# Patient Record
Sex: Male | Born: 1940 | Race: White | Hispanic: No | Marital: Married | Smoking: Former smoker
Health system: Southern US, Community
[De-identification: ages and names within clinical notes are randomized; demographics above are authoritative.]

## PROBLEM LIST (undated history)

## (undated) DIAGNOSIS — J45909 Unspecified asthma, uncomplicated: Secondary | ICD-10-CM

## (undated) DIAGNOSIS — M12811 Other specific arthropathies, not elsewhere classified, right shoulder: Secondary | ICD-10-CM

## (undated) DIAGNOSIS — E785 Hyperlipidemia, unspecified: Secondary | ICD-10-CM

## (undated) DIAGNOSIS — I10 Essential (primary) hypertension: Secondary | ICD-10-CM

## (undated) DIAGNOSIS — E1140 Type 2 diabetes mellitus with diabetic neuropathy, unspecified: Secondary | ICD-10-CM

## (undated) DIAGNOSIS — M75101 Unspecified rotator cuff tear or rupture of right shoulder, not specified as traumatic: Secondary | ICD-10-CM

## (undated) HISTORY — PX: OTHER SURGICAL HISTORY: SHX169

---
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
## 1998-03-08 HISTORY — PX: TONSILLECTOMY: SUR1361

## 1998-03-08 HISTORY — PX: NASAL SEPTUM SURGERY: SHX37

## 2005-06-16 ENCOUNTER — Encounter: Admission: RE | Admit: 2005-06-16 | Discharge: 2005-06-16 | Payer: Self-pay | Admitting: Physician Assistant

## 2011-07-23 ENCOUNTER — Other Ambulatory Visit (HOSPITAL_COMMUNITY): Payer: Self-pay | Admitting: Family Medicine

## 2011-07-23 DIAGNOSIS — R0602 Shortness of breath: Secondary | ICD-10-CM

## 2011-08-04 ENCOUNTER — Ambulatory Visit (HOSPITAL_COMMUNITY)
Admission: RE | Admit: 2011-08-04 | Discharge: 2011-08-04 | Disposition: A | Discharge status: Home / Self Care | Payer: Medicare Other | Source: Ambulatory Visit | Attending: Family Medicine | Admitting: Family Medicine

## 2011-08-04 DIAGNOSIS — R0602 Shortness of breath: Secondary | ICD-10-CM | POA: Insufficient documentation

## 2011-08-04 MED ORDER — ALBUTEROL SULFATE (5 MG/ML) 0.5% IN NEBU
2.5000 mg | INHALATION_SOLUTION | Freq: Once | RESPIRATORY_TRACT | Status: AC
  Administered 2011-08-04: 2.5 mg via RESPIRATORY_TRACT

## 2015-03-07 ENCOUNTER — Ambulatory Visit
Admission: RE | Admit: 2015-03-07 | Discharge: 2015-03-07 | Disposition: A | Discharge status: Home / Self Care | Payer: Self-pay | Source: Ambulatory Visit | Attending: *Deleted | Admitting: *Deleted

## 2015-03-07 ENCOUNTER — Other Ambulatory Visit: Payer: Self-pay | Admitting: Family Medicine

## 2015-03-07 ENCOUNTER — Other Ambulatory Visit: Payer: Self-pay | Admitting: *Deleted

## 2015-03-07 DIAGNOSIS — M5410 Radiculopathy, site unspecified: Secondary | ICD-10-CM

## 2018-06-14 ENCOUNTER — Encounter: Payer: Medicare Other | Admitting: Neurology

## 2018-09-20 ENCOUNTER — Ambulatory Visit (INDEPENDENT_AMBULATORY_CARE_PROVIDER_SITE_OTHER): Payer: Medicare Other | Admitting: Neurology

## 2018-09-20 ENCOUNTER — Other Ambulatory Visit: Payer: Self-pay

## 2018-09-20 ENCOUNTER — Encounter: Payer: Self-pay | Admitting: Neurology

## 2018-09-20 ENCOUNTER — Ambulatory Visit: Payer: Medicare Other | Admitting: Neurology

## 2018-09-20 DIAGNOSIS — E1142 Type 2 diabetes mellitus with diabetic polyneuropathy: Secondary | ICD-10-CM

## 2018-09-20 DIAGNOSIS — E1140 Type 2 diabetes mellitus with diabetic neuropathy, unspecified: Secondary | ICD-10-CM

## 2018-09-20 HISTORY — DX: Type 2 diabetes mellitus with diabetic neuropathy, unspecified: E11.40

## 2018-09-20 NOTE — Procedures (Signed)
     HISTORY:  [name] is a 78 year old gentleman with a history of diabetes who has a one-year history of some numbness in the left greater than right foot.  He denies any back pain or pain down the leg but he does have some discomfort in the medial aspect of the left knee.  He is being evaluated for a possible neuropathy or a lumbosacral radiculopathy.  NERVE CONDUCTION STUDIES:  Nerve conduction studies were performed on both lower extremities.  The distal motor latencies for the peroneal and posterior tibial nerves were normal bilaterally with low motor amplitudes seen for the posterior tibial nerves bilaterally, normal for the peroneal nerves bilaterally.  The nerve conduction velocities for the left peroneal nerve and for the posterior tibial nerves bilaterally were slowed, normal for the right peroneal nerve.  The sensory latencies for the peroneal nerves were prolonged bilaterally, normal for the sural nerves bilaterally.  The F-wave latencies for the posterior tibial nerves were prolonged bilaterally.  EMG STUDIES:  EMG study was performed on the left lower extremity:  The tibialis anterior muscle reveals 2 to 4K motor units with slightly reduced recruitment. No fibrillations or positive waves were seen. The peroneus tertius muscle reveals 2 to 4K motor units with slightly reduced recruitment. No fibrillations or positive waves were seen. The medial gastrocnemius muscle reveals 1 to 3K motor units with full recruitment. No fibrillations or positive waves were seen. The vastus lateralis muscle reveals 2 to 4K motor units with full recruitment. No fibrillations or positive waves were seen. The iliopsoas muscle reveals 2 to 4K motor units with full recruitment. No fibrillations or positive waves were seen. The biceps femoris muscle (long head) reveals 2 to 4K motor units with full recruitment. No fibrillations or positive waves were seen. The lumbosacral paraspinal muscles were  tested at 3 levels, and revealed no abnormalities of insertional activity at all 3 levels tested. There was good relaxation.   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  EMG evaluation of the left lower extremity does not show evidence of an overlying lumbosacral radiculopathy.  Jill Alexanders MD 09/20/2018 1:43 PM  [facility] [address] [place], [place] [address]  Phone [phone] Fax [phone]

## 2018-09-20 NOTE — Progress Notes (Signed)
[name]    Nerve / Sites Muscle Latency Ref. Amplitude Ref. Rel Amp Segments Distance Velocity Ref. Area    ms ms mV mV %  cm m/s m/s mVms  R Peroneal - EDB     Ankle EDB 5.1 ?6.5 5.2 ?2.0 100 Ankle - EDB 9   18.0     Fib head EDB 11.4  5.0  95.1 Fib head - Ankle 30 48 ?44 17.4     Pop fossa EDB 13.6  4.9  98.7 Pop fossa - Fib head 10 44 ?44 17.1         Pop fossa - Ankle      L Peroneal - EDB     Ankle EDB 5.0 ?6.5 4.4 ?2.0 100 Ankle - EDB 9   13.5     Fib head EDB 12.4  3.8  86.3 Fib head - Ankle 30 41 ?44 12.2     Pop fossa EDB 14.8  3.7  96.7 Pop fossa - Fib head 10 41 ?44 12.1         Pop fossa - Ankle      R Tibial - AH     Ankle AH 4.0 ?5.8 2.8 ?4.0 100 Ankle - AH 9   10.0     Pop fossa AH 14.3  1.6  56.9 Pop fossa - Ankle 38 37 ?41 5.7  L Tibial - AH     Ankle AH 3.4 ?5.8 2.3 ?4.0 100 Ankle - AH 9   7.0     Pop fossa AH 14.7  1.9  80.1 Pop fossa - Ankle 39 35 ?41 5.1             SNC    Nerve / Sites Rec. Site Peak Lat Ref.  Amp Ref. Segments Distance    ms ms V V  cm  R Sural - Ankle (Calf)     Calf Ankle 3.9 ?4.4 6 ?6 Calf - Ankle 14  L Sural - Ankle (Calf)     Calf Ankle 4.2 ?4.4 6 ?6 Calf - Ankle 14  R Superficial peroneal - Ankle     Lat leg Ankle 4.6 ?4.4 4 ?6 Lat leg - Ankle 14  L Superficial peroneal - Ankle     Lat leg Ankle 4.7 ?4.4 4 ?6 Lat leg - Ankle 14              F  Wave    Nerve F Lat Ref.   ms ms  R Tibial - AH 68.6 ?56.0  L Tibial - AH 64.6 ?56.0

## 2018-09-20 NOTE — Progress Notes (Signed)
Please refer to EMG and nerve conduction procedure note.  

## 2019-03-05 ENCOUNTER — Other Ambulatory Visit: Payer: Self-pay

## 2019-03-05 ENCOUNTER — Ambulatory Visit
Admission: RE | Admit: 2019-03-05 | Discharge: 2019-03-05 | Disposition: A | Discharge status: Home / Self Care | Payer: Medicare Other | Source: Ambulatory Visit | Attending: Family Medicine | Admitting: Family Medicine

## 2019-03-05 ENCOUNTER — Other Ambulatory Visit: Payer: Self-pay | Admitting: Family Medicine

## 2019-03-05 DIAGNOSIS — Z01811 Encounter for preprocedural respiratory examination: Secondary | ICD-10-CM

## 2019-03-15 NOTE — H&P (Signed)
  Patient's anticipated LOS is less than 2 midnights, meeting these requirements: - Younger than 51 - Lives within 1 hour of care - Has a competent adult at home to recover with post-op recover - NO history of  - Chronic pain requiring opiods  - Diabetes  - Coronary Artery Disease  - Heart failure  - Heart attack  - Stroke  - DVT/VTE  - Cardiac arrhythmia  - Respiratory Failure/COPD  - Renal failure  - Anemia  - Advanced Liver disease       [name] is an 79 y.o. male.    Chief Complaint: right shoulder pain  HPI: Pt is a 79 y.o. male complaining of right shoulder pain for multiple weeks. Pain had continually increased since the beginning. X-rays in the clinic show rotator cuff tear right shoulder. Pt has tried various conservative treatments which have failed to alleviate their symptoms, including injections and therapy. Various options are discussed with the patient. Risks, benefits and expectations were discussed with the patient. Patient understand the risks, benefits and expectations and wishes to proceed with surgery.   PCP:  Kaleen Mask, MD  D/C Plans: Home  PMH: Past Medical History:  Diagnosis Date  . Diabetic neuropathy (HCC) 09/20/2018    PSH: No past surgical history on file.  Social History:  has no history on file for tobacco, alcohol, and drug.  Allergies:  Not on File  Medications: No current facility-administered medications for this encounter.   No current outpatient medications on file.    No results found for this or any previous visit (from the past 48 hour(s)). No results found.  ROS: Pain with rom of the right upper extremity  Physical Exam: Alert and oriented 78 y.o. male in no acute distress Cranial nerves 2-12 intact Cervical spine: full rom with no tenderness, nv intact distally Chest: active breath sounds bilaterally, no wheeze rhonchi or rales Heart: regular rate and rhythm, no murmur Abd: non tender non  distended with active bowel sounds Hip is stable with rom  Right upper extremity with painful rom Weakness with ER and IR No rashes or edema  Assessment/Plan Assessment: right shoulder rotator cuff tear  Plan:  Patient will undergo a right shoulder cuff repair by Dr. Ranell Patrick at [facility]. Risks benefits and expectations were discussed with the patient. Patient understand risks, benefits and expectations and wishes to proceed. Preoperative templating of the joint replacement has been completed, documented, and submitted to the Operating Room personnel in order to optimize intra-operative equipment management.   Alphonsa Overall PA-C, MPAS [facility] is now [name] [address], [place], [place] [address] Phone: [phone] [email or web address] Facebook  [employer]

## 2019-03-20 ENCOUNTER — Encounter (HOSPITAL_BASED_OUTPATIENT_CLINIC_OR_DEPARTMENT_OTHER): Payer: Self-pay | Admitting: Orthopedic Surgery

## 2019-03-20 ENCOUNTER — Other Ambulatory Visit (HOSPITAL_COMMUNITY)
Admission: RE | Admit: 2019-03-20 | Discharge: 2019-03-20 | Disposition: A | Discharge status: Home / Self Care | Payer: Medicare Other | Source: Ambulatory Visit | Attending: Orthopedic Surgery | Admitting: Orthopedic Surgery

## 2019-03-20 ENCOUNTER — Other Ambulatory Visit: Payer: Self-pay

## 2019-03-20 DIAGNOSIS — Z01812 Encounter for preprocedural laboratory examination: Secondary | ICD-10-CM | POA: Insufficient documentation

## 2019-03-20 DIAGNOSIS — Z20822 Contact with and (suspected) exposure to covid-19: Secondary | ICD-10-CM | POA: Insufficient documentation

## 2019-03-20 NOTE — Progress Notes (Addendum)
Spoke w/ via phone for pre-op interview---[name] needs dos----  I stat 8            Fry results------chest xray 03-05-2019 chart/epic, requested ekg with tracing dr Jeannetta Nap office  COVID test ------03-20-2019 Arrive at -------845 am 03-23-2019 NPO after ------midnight food, clear liquids from midnight until 745 am then npo Medications to take morning of surgery -----tamsulosin, escitalopram Diabetic medication -----no diabetic meds am of surgery Patient Special Instructions -----hibiclens shower am of surgery Pre-Op special Istructions ----- Patient verbalized understanding of instructions that were given at this phone interview. Patient denies shortness of breath, chest pain, fever, cough a this phone interview.  Addendum: requested ekg x 2 dr Jeannetta Nap, no ekg received, ekg ordered in epic

## 2019-03-21 LAB — NOVEL CORONAVIRUS, NAA (HOSP ORDER, SEND-OUT TO REF LAB; TAT 18-24 HRS): SARS-CoV-2, NAA: NOT DETECTED

## 2019-03-23 ENCOUNTER — Ambulatory Visit (HOSPITAL_BASED_OUTPATIENT_CLINIC_OR_DEPARTMENT_OTHER): Payer: Medicare Other | Admitting: Anesthesiology

## 2019-03-23 ENCOUNTER — Encounter (HOSPITAL_BASED_OUTPATIENT_CLINIC_OR_DEPARTMENT_OTHER): Payer: Self-pay | Admitting: Orthopedic Surgery

## 2019-03-23 ENCOUNTER — Other Ambulatory Visit: Payer: Self-pay

## 2019-03-23 ENCOUNTER — Encounter (HOSPITAL_BASED_OUTPATIENT_CLINIC_OR_DEPARTMENT_OTHER)
Admission: RE | Disposition: A | Discharge status: Home / Self Care | Payer: Self-pay | Source: Home / Self Care | Attending: Orthopedic Surgery

## 2019-03-23 ENCOUNTER — Ambulatory Visit (HOSPITAL_BASED_OUTPATIENT_CLINIC_OR_DEPARTMENT_OTHER)
Admission: RE | Admit: 2019-03-23 | Discharge: 2019-03-23 | Disposition: A | Discharge status: Home / Self Care | Payer: Medicare Other | Attending: Orthopedic Surgery | Admitting: Orthopedic Surgery

## 2019-03-23 DIAGNOSIS — Z7984 Long term (current) use of oral hypoglycemic drugs: Secondary | ICD-10-CM | POA: Insufficient documentation

## 2019-03-23 DIAGNOSIS — M12811 Other specific arthropathies, not elsewhere classified, right shoulder: Secondary | ICD-10-CM | POA: Diagnosis not present

## 2019-03-23 DIAGNOSIS — E669 Obesity, unspecified: Secondary | ICD-10-CM | POA: Diagnosis not present

## 2019-03-23 DIAGNOSIS — Z7982 Long term (current) use of aspirin: Secondary | ICD-10-CM | POA: Insufficient documentation

## 2019-03-23 DIAGNOSIS — E785 Hyperlipidemia, unspecified: Secondary | ICD-10-CM | POA: Insufficient documentation

## 2019-03-23 DIAGNOSIS — I10 Essential (primary) hypertension: Secondary | ICD-10-CM | POA: Insufficient documentation

## 2019-03-23 DIAGNOSIS — E1140 Type 2 diabetes mellitus with diabetic neuropathy, unspecified: Secondary | ICD-10-CM | POA: Insufficient documentation

## 2019-03-23 DIAGNOSIS — M75101 Unspecified rotator cuff tear or rupture of right shoulder, not specified as traumatic: Secondary | ICD-10-CM | POA: Insufficient documentation

## 2019-03-23 DIAGNOSIS — Z79899 Other long term (current) drug therapy: Secondary | ICD-10-CM | POA: Insufficient documentation

## 2019-03-23 DIAGNOSIS — F419 Anxiety disorder, unspecified: Secondary | ICD-10-CM | POA: Insufficient documentation

## 2019-03-23 DIAGNOSIS — Z6834 Body mass index (BMI) 34.0-34.9, adult: Secondary | ICD-10-CM | POA: Diagnosis not present

## 2019-03-23 HISTORY — DX: Unspecified asthma, uncomplicated: J45.909

## 2019-03-23 HISTORY — DX: Essential (primary) hypertension: I10

## 2019-03-23 HISTORY — PX: SHOULDER ARTHROSCOPY WITH ROTATOR CUFF REPAIR: SHX5685

## 2019-03-23 HISTORY — DX: Other specific arthropathies, not elsewhere classified, right shoulder: M12.811

## 2019-03-23 HISTORY — DX: Hyperlipidemia, unspecified: E78.5

## 2019-03-23 HISTORY — DX: Unspecified rotator cuff tear or rupture of right shoulder, not specified as traumatic: M75.101

## 2019-03-23 LAB — POCT I-STAT, CHEM 8
BUN: 12 mg/dL (ref 8–23)
Calcium, Ion: 1.17 mmol/L (ref 1.15–1.40)
Chloride: 98 mmol/L (ref 98–111)
Creatinine, Ser: 0.9 mg/dL (ref 0.61–1.24)
Glucose, Bld: 187 mg/dL — ABNORMAL HIGH (ref 70–99)
HCT: 49 % (ref 39.0–52.0)
Hemoglobin: 16.7 g/dL (ref 13.0–17.0)
Potassium: 3.6 mmol/L (ref 3.5–5.1)
Sodium: 139 mmol/L (ref 135–145)
TCO2: 27 mmol/L (ref 22–32)

## 2019-03-23 LAB — GLUCOSE, CAPILLARY: Glucose-Capillary: 134 mg/dL — ABNORMAL HIGH (ref 70–99)

## 2019-03-23 SURGERY — ARTHROSCOPY, SHOULDER, WITH ROTATOR CUFF REPAIR
Anesthesia: General | Laterality: Right

## 2019-03-23 MED ORDER — FENTANYL CITRATE (PF) 100 MCG/2ML IJ SOLN
25.0000 ug | INTRAMUSCULAR | Status: DC | PRN
  Filled 2019-03-23: qty 1

## 2019-03-23 MED ORDER — ARTIFICIAL TEARS OPHTHALMIC OINT
TOPICAL_OINTMENT | OPHTHALMIC | Status: AC
  Filled 2019-03-23: qty 3.5

## 2019-03-23 MED ORDER — METHOCARBAMOL 500 MG PO TABS: 500.0000 mg | ORAL_TABLET | Freq: Four times a day (QID) | ORAL | 1 refills | Status: DC | PRN

## 2019-03-23 MED ORDER — CHLORHEXIDINE GLUCONATE 4 % EX LIQD
60.0000 mL | Freq: Once | CUTANEOUS | Status: DC
  Filled 2019-03-23: qty 118

## 2019-03-23 MED ORDER — SUGAMMADEX SODIUM 200 MG/2ML IV SOLN
INTRAVENOUS | Status: DC | PRN
  Administered 2019-03-23: 200 mg via INTRAVENOUS

## 2019-03-23 MED ORDER — OXYCODONE HCL 5 MG/5ML PO SOLN
5.0000 mg | Freq: Once | ORAL | Status: DC | PRN
  Filled 2019-03-23: qty 5

## 2019-03-23 MED ORDER — ROCURONIUM BROMIDE 10 MG/ML (PF) SYRINGE
PREFILLED_SYRINGE | INTRAVENOUS | Status: AC
  Filled 2019-03-23: qty 10

## 2019-03-23 MED ORDER — ONDANSETRON HCL 4 MG/2ML IJ SOLN
INTRAMUSCULAR | Status: AC
  Filled 2019-03-23: qty 2

## 2019-03-23 MED ORDER — MIDAZOLAM HCL 2 MG/2ML IJ SOLN
INTRAMUSCULAR | Status: DC | PRN
  Administered 2019-03-23: 2 mg via INTRAVENOUS

## 2019-03-23 MED ORDER — LIDOCAINE 2% (20 MG/ML) 5 ML SYRINGE
INTRAMUSCULAR | Status: AC
  Filled 2019-03-23: qty 5

## 2019-03-23 MED ORDER — CEFAZOLIN SODIUM-DEXTROSE 2-4 GM/100ML-% IV SOLN
INTRAVENOUS | Status: AC
  Filled 2019-03-23: qty 100

## 2019-03-23 MED ORDER — PROPOFOL 10 MG/ML IV BOLUS
INTRAVENOUS | Status: DC | PRN
  Administered 2019-03-23: 40 mg via INTRAVENOUS
  Administered 2019-03-23: 150 mg via INTRAVENOUS

## 2019-03-23 MED ORDER — CEFAZOLIN SODIUM-DEXTROSE 2-4 GM/100ML-% IV SOLN
2.0000 g | INTRAVENOUS | Status: AC
  Administered 2019-03-23: 11:00:00 2 g via INTRAVENOUS
  Filled 2019-03-23: qty 100

## 2019-03-23 MED ORDER — LIDOCAINE 2% (20 MG/ML) 5 ML SYRINGE
INTRAMUSCULAR | Status: DC | PRN
  Administered 2019-03-23: 40 mg via INTRAVENOUS

## 2019-03-23 MED ORDER — FENTANYL CITRATE (PF) 100 MCG/2ML IJ SOLN
INTRAMUSCULAR | Status: DC | PRN
  Administered 2019-03-23 (×2): 50 ug via INTRAVENOUS

## 2019-03-23 MED ORDER — MIDAZOLAM HCL 2 MG/2ML IJ SOLN
INTRAMUSCULAR | Status: AC
  Filled 2019-03-23: qty 2

## 2019-03-23 MED ORDER — ONDANSETRON HCL 4 MG/2ML IJ SOLN
INTRAMUSCULAR | Status: DC | PRN
  Administered 2019-03-23: 4 mg via INTRAVENOUS

## 2019-03-23 MED ORDER — DEXAMETHASONE SODIUM PHOSPHATE 10 MG/ML IJ SOLN
INTRAMUSCULAR | Status: DC | PRN
  Administered 2019-03-23: 10 mg via INTRAVENOUS

## 2019-03-23 MED ORDER — BUPIVACAINE LIPOSOME 1.3 % IJ SUSP
INTRAMUSCULAR | Status: DC | PRN
  Administered 2019-03-23: 10 mL via PERINEURAL

## 2019-03-23 MED ORDER — KETOROLAC TROMETHAMINE 30 MG/ML IJ SOLN
INTRAMUSCULAR | Status: DC | PRN
  Administered 2019-03-23: 30 mg via INTRAVENOUS

## 2019-03-23 MED ORDER — FENTANYL CITRATE (PF) 100 MCG/2ML IJ SOLN
INTRAMUSCULAR | Status: AC
  Filled 2019-03-23: qty 2

## 2019-03-23 MED ORDER — ROCURONIUM BROMIDE 10 MG/ML (PF) SYRINGE
PREFILLED_SYRINGE | INTRAVENOUS | Status: DC | PRN
  Administered 2019-03-23: 20 mg via INTRAVENOUS
  Administered 2019-03-23: 50 mg via INTRAVENOUS

## 2019-03-23 MED ORDER — OXYCODONE HCL 5 MG PO TABS
5.0000 mg | ORAL_TABLET | Freq: Once | ORAL | Status: DC | PRN
  Filled 2019-03-23: qty 1

## 2019-03-23 MED ORDER — LACTATED RINGERS IV SOLN
INTRAVENOUS | Status: DC
  Filled 2019-03-23 (×2): qty 1000

## 2019-03-23 MED ORDER — KETOROLAC TROMETHAMINE 30 MG/ML IJ SOLN
INTRAMUSCULAR | Status: AC
  Filled 2019-03-23: qty 1

## 2019-03-23 MED ORDER — PHENYLEPHRINE HCL-NACL 20-0.9 MG/250ML-% IV SOLN
INTRAVENOUS | Status: DC | PRN
  Administered 2019-03-23: 100 ug/min via INTRAVENOUS

## 2019-03-23 MED ORDER — PROPOFOL 10 MG/ML IV BOLUS
INTRAVENOUS | Status: AC
  Filled 2019-03-23: qty 40

## 2019-03-23 MED ORDER — BUPIVACAINE HCL (PF) 0.5 % IJ SOLN
INTRAMUSCULAR | Status: DC | PRN
  Administered 2019-03-23: 15 mL via PERINEURAL

## 2019-03-23 MED ORDER — OXYCODONE-ACETAMINOPHEN 5-325 MG PO TABS: 1.0000 | ORAL_TABLET | ORAL | 0 refills | Status: AC | PRN

## 2019-03-23 MED ORDER — BUPIVACAINE HCL (PF) 0.5 % IJ SOLN: INTRAMUSCULAR | Status: DC | PRN

## 2019-03-23 MED ORDER — EPHEDRINE 5 MG/ML INJ
INTRAVENOUS | Status: AC
  Filled 2019-03-23: qty 10

## 2019-03-23 MED ORDER — ONDANSETRON HCL 4 MG PO TABS: 4.0000 mg | ORAL_TABLET | Freq: Three times a day (TID) | ORAL | 1 refills | Status: AC | PRN

## 2019-03-23 MED ORDER — EPHEDRINE SULFATE 50 MG/ML IJ SOLN
INTRAMUSCULAR | Status: DC | PRN
  Administered 2019-03-23: 10 mg via INTRAVENOUS

## 2019-03-23 MED ORDER — DEXAMETHASONE SODIUM PHOSPHATE 10 MG/ML IJ SOLN
INTRAMUSCULAR | Status: AC
  Filled 2019-03-23: qty 1

## 2019-03-23 MED ORDER — ONDANSETRON HCL 4 MG/2ML IJ SOLN
4.0000 mg | Freq: Once | INTRAMUSCULAR | Status: DC | PRN
  Filled 2019-03-23: qty 2

## 2019-03-23 MED ORDER — PHENYLEPHRINE HCL (PRESSORS) 10 MG/ML IV SOLN
INTRAVENOUS | Status: AC
  Filled 2019-03-23: qty 2

## 2019-03-23 SURGICAL SUPPLY — 80 items
AID PSTN UNV HD RSTRNT DISP (MISCELLANEOUS) ×1
ANCH SUT 1.3 2 RBN BLU WHT (Anchor) ×1 IMPLANT
ANCH SUT 2 1.3X1 LD 1 STRN (Anchor) ×1 IMPLANT
ANCHOR ALL SUT RC W2 1.3 RIB (Anchor) ×2 IMPLANT
ANCHOR ALL-SUT FLEX 1.3 Y-KNOT (Anchor) ×2 IMPLANT
ANCHOR ALL-SUT RC W2 1.3 RIB (Anchor) IMPLANT
BIT DRILL 1.3M DISPOSABLE (BIT) ×2 IMPLANT
BLADE AVERAGE 25MMX9MM (BLADE) ×1
BLADE AVERAGE 25X9 (BLADE) ×1 IMPLANT
BLADE EXCALIBUR 4.0MM X 13CM (MISCELLANEOUS) ×1
BLADE EXCALIBUR 4.0X13 (MISCELLANEOUS) ×1 IMPLANT
BLADE MICRO SAGITTAL (BLADE) IMPLANT
BLADE SURG 11 STRL SS (BLADE) ×3 IMPLANT
BLADE SURG 15 STRL LF DISP TIS (BLADE) IMPLANT
BLADE SURG 15 STRL SS (BLADE) ×3
BURR OVAL 8 FLU 4.0MM X 13CM (MISCELLANEOUS)
BURR OVAL 8 FLU 4.0X13 (MISCELLANEOUS) IMPLANT
CLOSURE WOUND 1/2 X4 (GAUZE/BANDAGES/DRESSINGS) ×1
COVER WAND RF STERILE (DRAPES) ×3 IMPLANT
DISSECTOR  3.8MM X 13CM (MISCELLANEOUS) ×2
DISSECTOR 3.8MM X 13CM (MISCELLANEOUS) IMPLANT
DRAPE INCISE IOBAN 66X45 STRL (DRAPES) ×3 IMPLANT
DRAPE ORTHO SPLIT 77X108 STRL (DRAPES) ×6
DRAPE SHEET LG 3/4 BI-LAMINATE (DRAPES) ×3 IMPLANT
DRAPE STERI 35X30 U-POUCH (DRAPES) ×3 IMPLANT
DRAPE SURG ORHT 6 SPLT 77X108 (DRAPES) ×2 IMPLANT
DRAPE U-SHAPE 47X51 STRL (DRAPES) ×3 IMPLANT
DRSG ADAPTIC 3X8 NADH LF (GAUZE/BANDAGES/DRESSINGS) ×2 IMPLANT
DRSG EMULSION OIL 3X3 NADH (GAUZE/BANDAGES/DRESSINGS) IMPLANT
DRSG PAD ABDOMINAL 8X10 ST (GAUZE/BANDAGES/DRESSINGS) ×3 IMPLANT
DURAPREP 26ML APPLICATOR (WOUND CARE) ×3 IMPLANT
ELECT NDL TIP 2.8 STRL (NEEDLE) IMPLANT
ELECT NEEDLE TIP 2.8 STRL (NEEDLE) ×3 IMPLANT
ELECT REM PT RETURN 9FT ADLT (ELECTROSURGICAL)
ELECTRODE REM PT RTRN 9FT ADLT (ELECTROSURGICAL) IMPLANT
GAUZE SPONGE 4X4 12PLY STRL (GAUZE/BANDAGES/DRESSINGS) ×3 IMPLANT
GLOVE BIOGEL PI IND STRL 7.5 (GLOVE) ×1 IMPLANT
GLOVE BIOGEL PI IND STRL 8.5 (GLOVE) ×1 IMPLANT
GLOVE BIOGEL PI INDICATOR 7.5 (GLOVE) ×2
GLOVE BIOGEL PI INDICATOR 8.5 (GLOVE) ×2
GLOVE BIOGEL PI ORTHO PRO 7.5 (GLOVE) ×2
GLOVE BIOGEL PI ORTHO PRO SZ8 (GLOVE) ×2
GLOVE PI ORTHO PRO STRL 7.5 (GLOVE) ×1 IMPLANT
GLOVE PI ORTHO PRO STRL SZ8 (GLOVE) ×1 IMPLANT
GOWN STRL REUS W/TWL XL LVL3 (GOWN DISPOSABLE) ×12 IMPLANT
IV NS IRRIG 3000ML ARTHROMATIC (IV SOLUTION) ×6 IMPLANT
KIT TURNOVER CYSTO (KITS) ×3 IMPLANT
MANIFOLD NEPTUNE II (INSTRUMENTS) ×3 IMPLANT
NDL MAYO 6 CRC TAPER PT (NEEDLE) ×1 IMPLANT
NDL SPNL 18GX3.5 QUINCKE PK (NEEDLE) ×1 IMPLANT
NEEDLE MAYO 6 CRC TAPER PT (NEEDLE) ×3 IMPLANT
NEEDLE SPNL 18GX3.5 QUINCKE PK (NEEDLE) ×3 IMPLANT
NS IRRIG 1000ML POUR BTL (IV SOLUTION) ×3 IMPLANT
PACK BASIN DAY SURGERY FS (CUSTOM PROCEDURE TRAY) ×3 IMPLANT
PACK SHOULDER (CUSTOM PROCEDURE TRAY) ×3 IMPLANT
PAD ABD 8X10 STRL (GAUZE/BANDAGES/DRESSINGS) ×2 IMPLANT
PORT APPOLLO RF 90DEGREE MULTI (SURGICAL WAND) IMPLANT
RESTRAINT HEAD UNIVERSAL NS (MISCELLANEOUS) ×3 IMPLANT
SLING ARM FOAM STRAP LRG (SOFTGOODS) ×3 IMPLANT
SLING ARM FOAM STRAP MED (SOFTGOODS) IMPLANT
SPONGE LAP 4X18 RFD (DISPOSABLE) IMPLANT
STRIP CLOSURE SKIN 1/2X4 (GAUZE/BANDAGES/DRESSINGS) ×3 IMPLANT
SUCTION FRAZIER HANDLE 10FR (MISCELLANEOUS)
SUCTION TUBE FRAZIER 10FR DISP (MISCELLANEOUS) IMPLANT
SUT BONE WAX W31G (SUTURE) ×3 IMPLANT
SUT HI-FI 2 STRAND C-2 40 (SUTURE) ×4 IMPLANT
SUT MNCRL AB 3-0 PS2 18 (SUTURE) ×3 IMPLANT
SUT MNCRL AB 4-0 PS2 18 (SUTURE) IMPLANT
SUT VIC AB 0 CT1 27 (SUTURE) ×3
SUT VIC AB 0 CT1 27XBRD ANBCTR (SUTURE) ×1 IMPLANT
SUT VIC AB 0 CT2 27 (SUTURE) ×3 IMPLANT
SUT VIC AB 2-0 CT1 27 (SUTURE) ×3
SUT VIC AB 2-0 CT1 TAPERPNT 27 (SUTURE) ×1 IMPLANT
TAPE CLOTH SURG 6X10 WHT LF (GAUZE/BANDAGES/DRESSINGS) ×2 IMPLANT
TOWEL OR 17X26 10 PK STRL BLUE (TOWEL DISPOSABLE) ×3 IMPLANT
TUBE CONNECTING 12'X1/4 (SUCTIONS) ×1
TUBE CONNECTING 12X1/4 (SUCTIONS) ×2 IMPLANT
TUBING ARTHROSCOPY IRRIG 16FT (MISCELLANEOUS) ×3 IMPLANT
WATER STERILE IRR 1000ML POUR (IV SOLUTION) ×3 IMPLANT
YANKAUER SUCT BULB TIP NO VENT (SUCTIONS) ×2 IMPLANT

## 2019-03-23 NOTE — Anesthesia Preprocedure Evaluation (Addendum)
Anesthesia Evaluation  Patient identified by MRN, date of birth, ID band Patient awake    Reviewed: Allergy & Precautions, NPO status , Patient's Chart, lab work & pertinent test results  History of Anesthesia Complications Negative for: history of anesthetic complications  Airway Mallampati: III  TM Distance: >3 FB Neck ROM: Full    Dental  (+) Dental Advisory Given, Edentulous Upper,    Pulmonary asthma ,    Pulmonary exam normal        Cardiovascular hypertension, Pt. on medications Normal cardiovascular exam     Neuro/Psych Anxiety  Neuromuscular disease (diabetic neuropathy) negative psych ROS   GI/Hepatic negative GI ROS, Neg liver ROS,   Endo/Other  diabetes, Type 2, Oral Hypoglycemic Agents Obesity   Renal/GU negative Renal ROS     Musculoskeletal  (+) Arthritis ,   Abdominal   Peds  Hematology negative hematology ROS (+)   Anesthesia Other Findings   Reproductive/Obstetrics                          Anesthesia Physical Anesthesia Plan  ASA: III  Anesthesia Plan: General   Post-op Pain Management:  Regional for Post-op pain   Induction: Intravenous  PONV Risk Score and Plan: 2 and Treatment may vary due to age or medical condition, Ondansetron and Dexamethasone  Airway Management Planned: Oral ETT  Additional Equipment: None  Intra-op Plan:   Post-operative Plan: Extubation in OR  Informed Consent: I have reviewed the patients History and Physical, chart, labs and discussed the procedure including the risks, benefits and alternatives for the proposed anesthesia with the patient or authorized representative who has indicated his/her understanding and acceptance.     Dental advisory given  Plan Discussed with: CRNA and Anesthesiologist  Anesthesia Plan Comments:        Anesthesia Quick Evaluation

## 2019-03-23 NOTE — Brief Op Note (Signed)
03/23/2019  12:58 PM  PATIENT:  [name]  79 y.o. male  PRE-OPERATIVE DIAGNOSIS:  Right shoulder cuff tear,SLAP tear, AC DJD  POST-OPERATIVE DIAGNOSIS:  Right shoulder cuff tear, SLAP tear, AC DJD  PROCEDURE:  Procedure(s) with comments: SHOULDER ARTHROSCOPY WITH ROTATOR CUFF REPAIR subacromial decompression open distal clavicle resection open biceps tenodesis (Right) - interscalene block open Mumford procedure, A-SAD  SURGEON:  Surgeon(s) and Role:    Beverely Low, MD - Primary  PHYSICIAN ASSISTANT:   ASSISTANTS: Thea Gist, PA-C   ANESTHESIA:   regional and general  EBL:  minimal   BLOOD ADMINISTERED:none  DRAINS: none   LOCAL MEDICATIONS USED:  NONE  SPECIMEN:  No Specimen  DISPOSITION OF SPECIMEN:  N/A  COUNTS:  YES  TOURNIQUET:  * No tourniquets in log *  DICTATION: .Other Dictation: Dictation Number [id number]  PLAN OF CARE: Discharge to home after PACU  PATIENT DISPOSITION:  PACU - hemodynamically stable.   Delay start of Pharmacological VTE agent (>24hrs) due to surgical blood loss or risk of bleeding: not applicable

## 2019-03-23 NOTE — Op Note (Signed)
[name], [name] MEDICAL RECORD GQ:[id number] ACCOUNT [id number] DATE OF BIRTH:Oct 22, 1940 FACILITY: [facility] LOCATION: [facility] PHYSICIAN:STEVEN Orlena Sheldon, MD  OPERATIVE REPORT  DATE OF PROCEDURE:  03/23/2019  PREOPERATIVE DIAGNOSES:   1.  Right shoulder rotator cuff tear. 2.  Superior labrum anterior posterior tear. 3.  Acromioclavicular joint arthritis.  POSTOPERATIVE DIAGNOSES:   1.  Right shoulder rotator cuff tear. 2.  Superior labrum anterior posterior tear. 3.  Acromioclavicular joint arthritis.  PROCEDURES PERFORMED:   1.  Right shoulder arthroscopy with arthroscopic subacromial decompression.   2.  Extensive intra-articular debridement of torn superior labrum anterior to posterior with arthroscopic biceps tenotomy. 3.  Mini open rotator cuff repair.  4.  Biceps tenodesis in the groove.  5.  Open distal clavicle resection.  SURGEON:  Esmond Plants MD  ASSISTANT:  Darol Destine, [place], who was scrubbed during the entire procedure and necessary for satisfactory completion of surgery.  ANESTHESIA:  General anesthesia was used, plus interscalene block.  ESTIMATED BLOOD LOSS:  Minimal.  FLUID REPLACEMENT:  1500 mL crystalloid.  INSTRUMENT COUNTS:  Correct.  COMPLICATIONS:  There were no complications.  ANTIBIOTICS:  Perioperative antibiotics were given.  INDICATIONS:  The patient is a 79 year old active male who presents with a history of worsening right shoulder pain and dysfunction secondary to rotator cuff tear, SLAP tear and AC arthritis.  The patient has preserved glenohumeral cartilage and actually  fairly good function.  We felt that based on the quality of his tissue on scan and no atrophy on his supraspinatus on MRI, that we could affect a repair and get him excellent pain relief and good restoration of function.  Risks and benefits of surgery  were discussed in detail with the patient.  Informed consent obtained.  DESCRIPTION OF PROCEDURE:  After an  adequate level of anesthesia was achieved, the patient was positioned in modified beach chair position.  Right shoulder correctly identified and sterilely prepped and draped in the usual manner.  Time-out called,  verifying correct patient, correct site.  The patient had full passive range of motion with no undue stiffness, no instability.  We entered the shoulder using standard portals, including anterior, posterior and lateral portals.  We identified significant  tearing of the intra-articular biceps.  There was a split tear with about 50% of the tendon torn.  The superior labrum was also torn with an extensive type 2 SLAP.  We performed a biceps tenotomy and labral debridement back to a stable labral rim.   Minimal chondromalacia noted in the glenohumeral joint, a little scuffing of the inferior labrum.  Subscap looked normal.  There was a big supraspinatus tear with retraction.  Infraspinatus and teres minor looked normal.  We placed the scope in the  subacromial space.  A thorough bursectomy and acromioplasty was performed, creating a type 1 acromial shaped with a butcher block technique.  Utilizing high-speed bur, we did release the CA ligament.  We had a nice subacromial decompression all the way  over the [facility] joint.  We noted there to be a full thickness tear of the supraspinatus with retraction.  We then concluded the arthroscopic portion of the procedure, made a small saber incision overlying the AC joint.  Dissection down through the  subcutaneous tissues using Bovie.  We identified the deltotrapezial fascia and incised in line with distal clavicle.  Subperiosteal dissection of distal clavicle performed, followed by excision of distal 2-3 mm of bone using an oscillating saw.  We  irrigated thoroughly, removed hypertrophied  capsule and osteophytes off the dorsal acromion at the [facility] joint margin.  We applied bone wax to the cut end of the clavicle.  We irrigated thoroughly again, made sure anterior and  posterior AC ligaments were  intact, which they were and then we went ahead and repaired the deltotrapezial fascia anatomically with 0 Vicryl suture, followed by 2-0 Vicryl for subcutaneous closure and 4-0 Monocryl for skin.  We addressed the biceps and the rotator cuff through a  single mini open incision, starting at the anterolateral border of the acromion and extending distally about 4 cm.  Dissection down through subcutaneous tissues.  We identified that fat stripe between the anterior and lateral heads of the deltoid and  divided that.  I placed Arthrex retractor, identified the biceps groove.  We delivered the biceps tendon out of the bicipital sheath.  It was split all the way down to the biceps groove area.  We whipstitched to tubularize it with #2 Hi-Fi suture and  then also reinforced to the tendon.  We then placed a single Y-Knot flex anchor through the floor of the biceps groove and brought that up through the suture in a mattress fashion.  We took the longitudinal whipstitch sutures after trimming away the  remainder of the biceps.  We took those through the rotator interval and tied over a soft tissue bridge, incorporating part of the subscapularis, so we had a double anchored biceps with good compression with the suture anchor suture and then the 90  degree turn with the longitudinal suture.  We oversewed with 0 Vicryl figure-of-eight suture.  We had a nice low profile tenodesis.  We then addressed the rotator cuff tear.  We had to mobilize with a Cobb elevator on both sides of the tendon.  We were  able to deliver that tendon back into its anatomic position.  We used a rongeur to prepare the bone and get it nice and bleeding, ready for healing.  A #2 Hi-Fi suture x2 in the free end of the tendon.  There was also a couple sutures posteriorly as the  tear started anterior, posterior and then cut around lateral to medial, so we had to repair that dog leg in the back with a side-to-side #2 Hi-Fi  and then a couple of side-to-side rotator interval stitches up front.  We a single Y-Knot RC anchor and  placed it at the articular margin of the greater tuberosity and brought that suture up in a double mattress fashion through the medial portion of the footprint, restoring that to bone and then took the lateral 2 sutures down through drill holes and tied  over the lateral bone bridge.  We had a nice anatomic repair, watertight, everything moving together as a unit.  We irrigated thoroughly.  We then went ahead and repaired the deltoid anatomically with 0 Vicryl suture, followed by 2-0 Vicryl for  subcutaneous closure and 4-0 Monocryl for skin and portals.  Steri-Strips applied, followed by sterile dressing.  The patient tolerated surgery well.  VN/NUANCE  D:03/23/2019 T:03/23/2019 JOB:[id number]

## 2019-03-23 NOTE — Transfer of Care (Signed)
Immediate Anesthesia Transfer of Care Note  Patient: [name]  Procedure(s) Performed: SHOULDER ARTHROSCOPY WITH ROTATOR CUFF REPAIR subacromial decompression open distal clavicle resection open biceps tenodesis (Right )  Patient Location: PACU  Anesthesia Type:General  Level of Consciousness: awake, alert , oriented and patient cooperative  Airway & Oxygen Therapy: Patient Spontanous Breathing and Patient connected to nasal cannula oxygen  Post-op Assessment: Report given to RN and Post -op Vital signs reviewed and stable  Post vital signs: Reviewed and stable  Last Vitals:  Vitals Value Taken Time  BP    Temp 37 C 03/23/19 1327  Pulse 83 03/23/19 1327  Resp 16 03/23/19 1327  SpO2 98 % 03/23/19 1327  Vitals shown include unvalidated device data.  Last Pain:  Vitals:   03/23/19 0935  TempSrc: Oral      Patients Stated Pain Goal: 4 (03/23/19 0935)  Complications: No apparent anesthesia complications

## 2019-03-23 NOTE — Discharge Instructions (Signed)
Ice to the shoulder constantly.  Keep the incision covered and clean and dry for one week, then ok to get it wet in the shower.  Do exercise as instructed several times per day. Wear the pillow sling under the arm at all times when out of the house, may use a simple pillow on the right hip and under the arm in the home.   Lean to the right side to allow your arm to drift away from your body as you remove your sling and hug a pillow.  Shoulder exercises:  Pendulums - dangle arm in circles at your side.  Rotation, with arm resting on a pillow and your elbow bent at 90 degrees, rotate your forearm in to your body and then out away from you like a gate swinging  Pillow slides - with your arm resting on the pillow on your lap, slide your hand away from you and towards you, back and forth  DO NOT reach behind your back or push up out of a chair with the operative arm.  Use a sling while you are up and around for comfort, may remove while seated.  Keep pillow propped behind the operative elbow.  Follow up with Dr Ranell Patrick in two weeks in the office, call [phone] for appt  NO ADVIL, ALEVE, MOTRIN, IBUPROFEN UNTIL 9 PM TONIGHT  Post Anesthesia Home Care Instructions  Activity: Get plenty of rest for the remainder of the day. A responsible individual must stay with you for 24 hours following the procedure.  For the next 24 hours, DO NOT: -Drive a car -Advertising copywriter -Drink alcoholic beverages -Take any medication unless instructed by your physician -Make any legal decisions or sign important papers.  Meals: Start with liquid foods such as gelatin or soup. Progress to regular foods as tolerated. Avoid greasy, spicy, heavy foods. If nausea and/or vomiting occur, drink only clear liquids until the nausea and/or vomiting subsides. Call your physician if vomiting continues.  Special Instructions/Symptoms: Your throat may feel dry or sore from the anesthesia or the breathing tube placed in  your throat during surgery. If this causes discomfort, gargle with warm salt water. The discomfort should disappear within 24 hours.  If you had a scopolamine patch placed behind your ear for the management of post- operative nausea and/or vomiting:  1. The medication in the patch is effective for 72 hours, after which it should be removed.  Wrap patch in a tissue and discard in the trash. Wash hands thoroughly with soap and water. 2. You may remove the patch earlier than 72 hours if you experience unpleasant side effects which may include dry mouth, dizziness or visual disturbances. 3. Avoid touching the patch. Wash your hands with soap and water after contact with the patch.    Regional Anesthesia Blocks  1. Numbness or the inability to move the "blocked" extremity may last from 3-48 hours after placement. The length of time depends on the medication injected and your individual response to the medication. If the numbness is not going away after 48 hours, call your surgeon.  2. The extremity that is blocked will need to be protected until the numbness is gone and the  Strength has returned. Because you cannot feel it, you will need to take extra care to avoid injury. Because it may be weak, you may have difficulty moving it or using it. You may not know what position it is in without looking at it while the block is in effect.  3. For blocks in the legs and feet, returning to weight bearing and walking needs to be done carefully. You will need to wait until the numbness is entirely gone and the strength has returned. You should be able to move your leg and foot normally before you try and bear weight or walk. You will need someone to be with you when you first try to ensure you do not fall and possibly risk injury.  4. Bruising and tenderness at the needle site are common side effects and will resolve in a few days.  5. Persistent numbness or new problems with movement should be communicated to  the surgeon or the [place] [phone] [place] [phone]).  Information for Discharge Teaching: EXPAREL (bupivacaine liposome injectable suspension)   Your surgeon or anesthesiologist gave you EXPAREL(bupivacaine) to help control your pain after surgery.   EXPAREL is a local anesthetic that provides pain relief by numbing the tissue around the surgical site.  EXPAREL is designed to release pain medication over time and can control pain for up to 72 hours.  Depending on how you respond to EXPAREL, you may require less pain medication during your recovery.  Possible side effects:  Temporary loss of sensation or ability to move in the area where bupivacaine was injected.  Nausea, vomiting, constipation  Rarely, numbness and tingling in your mouth or lips, lightheadedness, or anxiety may occur.  Call your doctor right away if you think you may be experiencing any of these sensations, or if you have other questions regarding possible side effects.  Follow all other discharge instructions given to you by your surgeon or nurse. Eat a healthy diet and drink plenty of water or other fluids.  If you return to the hospital for any reason within 96 hours following the administration of EXPAREL, it is important for health care providers to know that you have received this anesthetic. A teal colored band has been placed on your arm with the date, time and amount of EXPAREL you have received in order to alert and inform your health care providers. Please leave this armband in place for the full 96 hours following administration, and then you may remove the band.

## 2019-03-23 NOTE — Anesthesia Procedure Notes (Signed)
Anesthesia Regional Block: Interscalene brachial plexus block   Pre-Anesthetic Checklist: ,, timeout performed, Correct Patient, Correct Site, Correct Laterality, Correct Procedure, Correct Position, site marked, Risks and benefits discussed,  Surgical consent,  Pre-op evaluation,  At surgeon's request and post-op pain management  Laterality: Right  Prep: chloraprep       Needles:  Injection technique: Single-shot  Needle Type: Echogenic Needle     Needle Length: 5cm  Needle Gauge: 21     Additional Needles:   Narrative:  Start time: 03/23/2019 9:57 AM End time: 03/23/2019 10:01 AM Injection made incrementally with aspirations every 5 mL.  Performed by: Personally  Anesthesiologist: Beryle Lathe, MD  Additional Notes: No pain on injection. No increased resistance to injection. Injection made in 5cc increments. Good needle visualization. Patient tolerated the procedure well.

## 2019-03-23 NOTE — Progress Notes (Signed)
Assisted Dr. Brock with right, ultrasound guided, interscalene  block. Side rails up, monitors on throughout procedure. See vital signs in flow sheet. Tolerated Procedure well.  

## 2019-03-23 NOTE — Interval H&P Note (Signed)
History and Physical Interval Note:  03/23/2019 10:53 AM  Sheral Apley  has presented today for surgery, with the diagnosis of Right shoulder cuff tear.  The various methods of treatment have been discussed with the patient and family. After consideration of risks, benefits and other options for treatment, the patient has consented to  Procedure(s) with comments: SHOULDER ARTHROSCOPY WITH ROTATOR CUFF REPAIR subacromial decompression open distal clavicle resection open biceps tenodesis (Right) - interscalene block as a surgical intervention.  The patient's history has been reviewed, patient examined, no change in status, stable for surgery.  I have reviewed the patient's chart and labs.  Questions were answered to the patient's satisfaction.     Verlee Rossetti

## 2019-03-23 NOTE — Anesthesia Postprocedure Evaluation (Signed)
Anesthesia Post Note  Patient: [name]  Procedure(s) Performed: SHOULDER ARTHROSCOPY WITH ROTATOR CUFF REPAIR subacromial decompression open distal clavicle resection open biceps tenodesis (Right )     Patient location during evaluation: PACU Anesthesia Type: General Level of consciousness: awake and alert Pain management: pain level controlled Vital Signs Assessment: post-procedure vital signs reviewed and stable Respiratory status: spontaneous breathing, nonlabored ventilation and respiratory function stable Cardiovascular status: blood pressure returned to baseline and stable Postop Assessment: no apparent nausea or vomiting Anesthetic complications: no    Last Vitals:  Vitals:   03/23/19 1345 03/23/19 1400  BP: 114/65 (!) 121/59  Pulse: 83 80  Resp: 17 (!) 25  Temp:    SpO2: 91% 90%    Last Pain:  Vitals:   03/23/19 1400  TempSrc:   PainSc: 0-No pain                 Beryle Lathe

## 2019-03-23 NOTE — Anesthesia Procedure Notes (Signed)
Procedure Name: Intubation Date/Time: 03/23/2019 11:06 AM Performed by: Wanita Chamberlain, CRNA Pre-anesthesia Checklist: Patient identified, Timeout performed, Emergency Drugs available, Suction available and Patient being monitored Patient Re-evaluated:Patient Re-evaluated prior to induction Oxygen Delivery Method: Circle system utilized Preoxygenation: Pre-oxygenation with 100% oxygen Induction Type: IV induction Ventilation: Mask ventilation without difficulty Laryngoscope Size: Mac and 4 Grade View: Grade I Tube type: Oral Tube size: 7.5 mm Number of attempts: 1 Placement Confirmation: breath sounds checked- equal and bilateral,  CO2 detector,  positive ETCO2 and ETT inserted through vocal cords under direct vision Secured at: 22 cm Tube secured with: Tape Dental Injury: Teeth and Oropharynx as per pre-operative assessment

## 2020-09-10 ENCOUNTER — Other Ambulatory Visit: Payer: Self-pay

## 2020-09-10 ENCOUNTER — Ambulatory Visit: Payer: Medicare Other | Admitting: Podiatry

## 2020-09-10 DIAGNOSIS — E119 Type 2 diabetes mellitus without complications: Secondary | ICD-10-CM | POA: Diagnosis not present

## 2020-09-11 ENCOUNTER — Encounter: Payer: Self-pay | Admitting: Podiatry

## 2020-09-11 NOTE — Progress Notes (Signed)
Subjective: [name] presents today referred by Kaleen Mask, MD for diabetic foot evaluation.  Patient relates to year history of diabetes.  Patient denies any history of foot wounds.  Patient denies any history of numbness, tingling, burning, pins/needles sensations.  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    Past Surgical History:  Procedure Laterality Date   arthroscopic knee surgery Bilateral age 82's   NASAL SEPTUM SURGERY  2000   SHOULDER ARTHROSCOPY WITH ROTATOR CUFF REPAIR Right 03/23/2019   Procedure: SHOULDER ARTHROSCOPY WITH ROTATOR CUFF REPAIR subacromial decompression open distal clavicle resection open biceps tenodesis;  Surgeon: Beverely Low, MD;  Location: [facility];  Service: Orthopedics;  Laterality: Right;  interscalene block   TONSILLECTOMY  2000    Current Outpatient Medications on File Prior to Visit  Medication Sig Dispense Refill   ALBUTEROL IN Inhale 1 puff into the lungs as needed.     aspirin EC 81 MG tablet Take 81 mg by mouth daily.     escitalopram (LEXAPRO) 10 MG tablet Take 10 mg by mouth daily.     GLIMEPIRIDE PO Take 2 mg by mouth daily. Takes 1/2 tab in am     lisinopril-hydrochlorothiazide (ZESTORETIC) 20-12.5 MG tablet Take 1 tablet by mouth.     methocarbamol (ROBAXIN) 500 MG tablet Take 1 tablet (500 mg total) by mouth every 6 (six) hours as needed for muscle spasms. 40 tablet 1   pravastatin (PRAVACHOL) 40 MG tablet Take 40 mg by mouth at bedtime.     tamsulosin (FLOMAX) 0.4 MG CAPS capsule Take 0.4 mg by mouth daily.     TRAZODONE HCL PO Take 50 mg by mouth at bedtime. Takes 1/2 tab     No current facility-administered  medications on file prior to visit.     No Known Allergies  Social History   Occupational History   Not on file  Tobacco Use   Smoking status: Never   Smokeless tobacco: Never  Vaping Use   Vaping Use: Never used  Substance and Sexual Activity   Alcohol use: Not on file    Comment: rare   Drug use: Never   Sexual activity: Not on file    No family history on file.   There is no immunization history on file for this patient.  Review of systems: Positive Findings in bold print.  Constitutional:  chills, fatigue, fever, sweats, weight change Communication: Nurse, learning disability, sign Presenter, broadcasting, hand writing, iPad/Android device Head: headaches, head injury Eyes: changes in vision, eye pain, glaucoma, cataracts, macular degeneration, diplopia, glare,  light sensitivity, eyeglasses or contacts, blindness Ears nose mouth throat: hearing impaired, hearing aids,  ringing in ears, deaf, sign language,  vertigo, nosebleeds,  rhinitis,  cold sores, snoring, swollen glands Cardiovascular: HTN, edema, arrhythmia, pacemaker in place, defibrillator in place, chest pain/tightness, chronic anticoagulation, blood clot, heart failure, MI Peripheral Vascular: leg cramps, varicose veins, blood clots, lymphedema, varicosities Respiratory:  asthma, difficulty breathing, denies congestion, SOB, wheezing, cough, emphysema Gastrointestinal: change in appetite or weight, abdominal pain, constipation, diarrhea, nausea, vomiting, vomiting blood, change in bowel habits, abdominal pain, jaundice, rectal bleeding, hemorrhoids, GERD Genitourinary:  nocturia,  pain on urination, polyuria,  blood in urine, Foley catheter, urinary urgency, ESRD on hemodialysis Musculoskeletal: amputation, cramping, stiff joints, painful joints, decreased joint motion, fractures, OA, gout, hemiplegia, paraplegia, uses cane, wheelchair bound, uses walker, uses rollator Skin: +changes in toenails, color change, dryness, itching, mole  changes,  rash, wound(s) Neurological: headaches, numbness in feet, paresthesias in feet, burning in feet, fainting,  seizures, change in speech, migraines, memory problems/poor historian, cerebral palsy, weakness, paralysis, CVA, TIA Endocrine: diabetes, hypothyroidism, hyperthyroidism,  goiter, dry mouth, flushing, heat intolerance, cold intolerance,  excessive thirst, denies polyuria,  nocturia Hematological:  easy bleeding, excessive bleeding, easy bruising, enlarged lymph nodes, on long term blood thinner, history of past transusions Allergy/immunological:  hives, eczema, frequent infections, multiple drug allergies, seasonal allergies, transplant recipient, multiple food allergies Psychiatric:  anxiety, depression, mood disorder, suicidal ideations, hallucinations, insomnia  Objective: There were no vitals filed for this visit. Vascular Examination: Capillary refill time less than 3 seconds x 10 digits.  Dorsalis pedis pulses palpable 2 out of 4.  Posterior tibial pulses palpable 2 out of 4.  Digital hair not present x 10 digits.  Skin temperature gradient WNL b/l.  Dermatological Examination: Skin with normal turgor, texture and tone b/l  Toenails 1-5 b/l discolored, thick, dystrophic with subungual debris and pain with palpation to nailbeds due to thickness of nails.  Musculoskeletal: Muscle strength 5/5 to all LE muscle groups.  Neurological: Sensation intact with 10 gram monofilament.  Vibratory sensation intact.  Assessment: NIDDM Encounter for diabetic foot examination  Plan: Discussed diabetic foot care principles. Literature dispensed on today. Patient to continue soft, supportive shoe gear daily. Patient to report any pedal injuries to medical professional immediately. Follow up one year. Patient/POA to call should there be a concern in the interim.

## 2020-09-22 ENCOUNTER — Other Ambulatory Visit: Payer: Self-pay

## 2020-09-22 DIAGNOSIS — I739 Peripheral vascular disease, unspecified: Secondary | ICD-10-CM

## 2020-09-30 ENCOUNTER — Other Ambulatory Visit: Payer: Self-pay

## 2020-09-30 ENCOUNTER — Ambulatory Visit (HOSPITAL_COMMUNITY)
Admission: RE | Admit: 2020-09-30 | Discharge: 2020-09-30 | Disposition: A | Discharge status: Home / Self Care | Payer: Medicare Other | Source: Ambulatory Visit | Attending: Vascular Surgery | Admitting: Vascular Surgery

## 2020-09-30 ENCOUNTER — Encounter: Payer: Self-pay | Admitting: Vascular Surgery

## 2020-09-30 ENCOUNTER — Ambulatory Visit: Payer: Medicare Other | Admitting: Vascular Surgery

## 2020-09-30 DIAGNOSIS — I739 Peripheral vascular disease, unspecified: Secondary | ICD-10-CM | POA: Insufficient documentation

## 2020-09-30 DIAGNOSIS — M79605 Pain in left leg: Secondary | ICD-10-CM | POA: Diagnosis not present

## 2020-09-30 DIAGNOSIS — M79604 Pain in right leg: Secondary | ICD-10-CM | POA: Diagnosis not present

## 2020-09-30 DIAGNOSIS — M79606 Pain in leg, unspecified: Secondary | ICD-10-CM | POA: Insufficient documentation

## 2020-09-30 NOTE — Progress Notes (Signed)
Patient name: [name] MRN: [id number] DOB: [birth year] Sex: male  REASON FOR CONSULT: Evaluate exertional leg pain and underlying PAD  HPI: [name] is a 80 y.o. male, with history of hypertension, hyperlipidemia, and diabetes that presents for evaluation of exertional leg pain and possible underlying PAD.  Patient states he has pain in both of his thighs and this can bother him when he is walking and sometimes even at night.  He also complains of numbness in both hands and both feet that often keep him awake at night.  He cannot sleep from the profound discomfort from the numbness.  He is unclear about a diagnosis of neuropathy and states he has had no management of his neuropathy.  He has been under the care of Dr. Jeannetta Nap at [facility].  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   Hyperlipidemia    Hypertension    Rotator cuff tear arthropathy of right shoulder     Past Surgical History:  Procedure Laterality Date   arthroscopic knee surgery Bilateral age 71's   NASAL SEPTUM SURGERY  2000   SHOULDER ARTHROSCOPY WITH ROTATOR CUFF REPAIR Right 03/23/2019   Procedure: SHOULDER ARTHROSCOPY WITH ROTATOR CUFF REPAIR subacromial decompression open distal clavicle resection open biceps tenodesis;  Surgeon: Beverely Low, MD;  Location: [facility];  Service: Orthopedics;  Laterality: Right;  interscalene block   TONSILLECTOMY  2000    History reviewed. No pertinent family history.  SOCIAL HISTORY: Social History   Socioeconomic History   Marital status: Married    Spouse name: Not on file   Number of children: Not on file   Years of education: Not on file   Highest education level: Not on file  Occupational History   Not on file  Tobacco Use   Smoking status: Never   Smokeless tobacco: Never  Vaping Use   Vaping Use: Never used  Substance  and Sexual Activity   Alcohol use: Not on file    Comment: rare   Drug use: Never   Sexual activity: Not on file  Other Topics Concern   Not on file  Social History Narrative   Not on file   Social Determinants of Health   Financial Resource Strain: Not on file  Food Insecurity: Not on file  Transportation Needs: Not on file  Physical Activity: Not on file  Stress: Not on file  Social Connections: Not on file  Intimate Partner Violence: Not on file    Allergies  Allergen Reactions   Other     Current Outpatient Medications  Medication Sig Dispense Refill   ALBUTEROL IN Inhale 1 puff into the lungs as needed.     aspirin EC 81 MG tablet Take 81 mg by mouth daily.     escitalopram (LEXAPRO) 10 MG tablet Take 10 mg by mouth daily.     GLIMEPIRIDE PO Take 2 mg by mouth daily. Takes 1/2 tab in am     lisinopril-hydrochlorothiazide (ZESTORETIC) 20-12.5 MG tablet Take 1 tablet by mouth.     methocarbamol (ROBAXIN) 500 MG tablet Take 1 tablet (500 mg total) by mouth every 6 (six) hours as needed for muscle spasms. 40 tablet 1   pravastatin (PRAVACHOL) 40 MG tablet Take 40 mg by mouth at bedtime.     tamsulosin (FLOMAX) 0.4 MG CAPS capsule Take 0.4 mg  by mouth daily.     TRAZODONE HCL PO Take 50 mg by mouth at bedtime. Takes 1/2 tab     No current facility-administered medications for this visit.    REVIEW OF SYSTEMS:  [X]  denotes positive finding, [ ]  denotes negative finding Cardiac  Comments:  Chest pain or chest pressure:    Shortness of breath upon exertion:    Short of breath when lying flat:    Irregular heart rhythm:        Vascular    Pain in calf, thigh, or hip brought on by ambulation:    Pain in feet at night that wakes you up from your sleep:     Blood clot in your veins:    Leg swelling:         Pulmonary    Oxygen at home:    Productive cough:     Wheezing:         Neurologic    Sudden weakness in arms or legs:     Sudden numbness in arms or legs:      Sudden onset of difficulty speaking or slurred speech:    Temporary loss of vision in one eye:     Problems with dizziness:         Gastrointestinal    Blood in stool:     Vomited blood:         Genitourinary    Burning when urinating:     Blood in urine:        Psychiatric    Major depression:         Hematologic    Bleeding problems:    Problems with blood clotting too easily:        Skin    Rashes or ulcers:        Constitutional    Fever or chills:      PHYSICAL EXAM: Vitals:   09/30/20 1230  BP: (!) 171/79  Pulse: 62  Resp: 16  Temp: 98.4 F (36.9 C)  TempSrc: Temporal  SpO2: 96%  Weight: 240 lb (108.9 kg)  Height: 5\' 9"  (1.753 m)    GENERAL: The patient is a well-nourished male, in no acute distress. The vital signs are documented above. CARDIAC: There is a regular rate and rhythm.  VASCULAR:  Palpable radial pulses bilaterally Palpable femoral pulses bilaterally Palpable DP PT pulses bilateral PULMONARY: No respiratory distress. ABDOMEN: Soft and non-tender. MUSCULOSKELETAL: There are no major deformities or cyanosis. NEUROLOGIC: No focal weakness or paresthesias are detected. SKIN: There are no ulcers or rashes noted. PSYCHIATRIC: The patient has a normal affect.  DATA:   ABIs today are 1.22 on the right triphasic and 1.12 on the left triphasic with no evidence of arterial insufficiency.  Assessment/Plan:  80 year old male presents for evaluation of exertional leg pain and possible underlying PAD.  I discussed with him in detail that he has a normal exam with palpable femoral and pedal pulses.  His noninvasive imaging is also normal with a normal triphasic waveform at the ankle and an ABI greater than 1.  I do not think his leg pain is due to arterial insufficiency which I discussed with him today.  In addition he complains of profound numbness in both his hands and feet that keep him awake at night.  I suspect this may be due to neuropathy  although patient states he has no diagnosis of neuropathy to his knowledge.  I have offered a referral to neurology for further evaluation  and assistance with management.  We will place a referral.  He can follow-up with me as needed.   Cephus Shelling, MD [facility] Office: [phone]

## 2020-11-11 ENCOUNTER — Ambulatory Visit: Payer: Medicare Other | Admitting: Neurology

## 2020-11-11 ENCOUNTER — Other Ambulatory Visit: Payer: Self-pay

## 2020-11-11 ENCOUNTER — Encounter: Payer: Self-pay | Admitting: Neurology

## 2020-11-11 VITALS — BP 145/82 | HR 68 | Ht 69.0 in | Wt 239.0 lb

## 2020-11-11 DIAGNOSIS — R200 Anesthesia of skin: Secondary | ICD-10-CM | POA: Diagnosis not present

## 2020-11-11 DIAGNOSIS — E1169 Type 2 diabetes mellitus with other specified complication: Secondary | ICD-10-CM

## 2020-11-11 DIAGNOSIS — R202 Paresthesia of skin: Secondary | ICD-10-CM | POA: Diagnosis not present

## 2020-11-11 NOTE — Progress Notes (Signed)
[facility]    Provider:  Dr Jaynee Eagles Requesting Provider: Leonard Downing, * Primary Care Provider:  Leonard Downing, MD  CC:  bilateral numbness fingers and feet  HPI:  [name] is a 80 y.o. male here as requested by Leonard Downing, * for numbness and tingling in the hands and feet. Hx of diabetes. Not a lot of pain just numbness and sensory changes. Ongoing for at least over 2 years. When it sleeps at night it gets very numb and wakes him in the hands. Tingling, burning, numbness mostly in the feet and in the hands.The back of his legs also hurt when walking, cramp up, different than the numbness and tingling in the hands and feet. Can happen at different times. The pain in the back of the upper legs happens when walking, he feels better sitting down and then he may be able to get back up, no low back pain but he does have some left hip pain. The feet moreso than the hands but always numb. He has a nw meter he checks his glucose with. Symmetrical. Changing positions will make the, better, wakes him up a lot and his whole arms and hands get numb.   EMG/NCS 09/20/2018: Dr. Jannifer Franklin: reviewed emg/ncs data Nerve conduction studies done on both lower extremities shows mild neuropathic changes consistent with a possible diabetic peripheral neuropathy.  EMG evaluation of the left lower extremity does not show evidence of an overlying lumbosacral radiculopathy.  Reviewed notes, labs and imaging from outside physicians, which showed:  XR 2016 lumbar spine: reviewed images and agree: FINDINGS: Degenerative spurring throughout the lumbar spine. Slight disc space narrowing throughout the lumbar spine. Normal alignment. No fracture. SI joints are symmetric and unremarkable.   IMPRESSION: Mild diffuse degenerative disc disease.  No acute findings.  Review of Systems: Patient complains of symptoms per HPI as well as the following symptoms thigh cramps. Pertinent  negatives and positives per HPI. All others negative.   Social History   Socioeconomic History   Marital status: Married    Spouse name: Not on file   Number of children: Not on file   Years of education: Not on file   Highest education level: Not on file  Occupational History   Not on file  Tobacco Use   Smoking status: Former    Types: Cigarettes   Smokeless tobacco: Never  Vaping Use   Vaping Use: Never used  Substance and Sexual Activity   Alcohol use: Not on file    Comment: rare   Drug use: Never   Sexual activity: Not on file  Other Topics Concern   Not on file  Social History Narrative   Caffeine- 2 cups every am.  Education: HS grad,  Retired (3 different business).     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   Hyperlipidemia    Hypertension    Rotator cuff tear arthropathy of right  shoulder     Patient Active Problem List   Diagnosis Date Noted   Leg pain 09/30/2020   Diabetic neuropathy ([place]) 09/20/2018    Past Surgical History:  Procedure Laterality Date   arthroscopic knee surgery Bilateral age 3's   NASAL SEPTUM SURGERY  2000   SHOULDER ARTHROSCOPY WITH ROTATOR CUFF REPAIR Right 03/23/2019   Procedure: SHOULDER ARTHROSCOPY WITH ROTATOR CUFF REPAIR subacromial decompression open distal clavicle resection open biceps tenodesis;  Surgeon: Netta Cedars, MD;  Location: [facility];  Service: Orthopedics;  Laterality: Right;  interscalene block   TONSILLECTOMY  2000    Current  Outpatient Medications  Medication Sig Dispense Refill   ALBUTEROL IN Inhale 1 puff into the lungs as needed.     aspirin EC 81 MG tablet Take 81 mg by mouth daily.     escitalopram (LEXAPRO) 10 MG tablet Take 5 mg by mouth daily.     GLIMEPIRIDE PO Take 2 mg by mouth daily. Takes 1/2 tab in am     lisinopril-hydrochlorothiazide (ZESTORETIC) 20-12.5 MG tablet Take 1 tablet by mouth.     omeprazole (PRILOSEC) 40 MG capsule Take 40 mg by mouth daily.     pravastatin (PRAVACHOL) 40 MG tablet Take 20 mg by mouth at bedtime.     tamsulosin (FLOMAX) 0.4 MG CAPS capsule Take 0.4 mg by mouth daily.     TRAZODONE HCL PO Take 50 mg by mouth at bedtime. Takes 1/2 tab     No current facility-administered medications for this visit.    Allergies as of 11/11/2020 - Review Complete 11/11/2020  Allergen Reaction Noted   Other  09/30/2020    Vitals: BP (!) 145/82   Pulse 68   Ht '5\' 9"'  (1.753 m)   Wt 239 lb (108.4 kg)   BMI 35.29 kg/m  Last Weight:  Wt Readings from Last 1 Encounters:  11/11/20 239 lb (108.4 kg)   Last Height:   Ht Readings from Last 1 Encounters:  11/11/20 '5\' 9"'  (1.753 m)     Physical exam: Exam: Gen: NAD, conversant, well nourised, obese, well groomed                     CV: RRR, no MRG. No Carotid Bruits. No peripheral edema, warm, nontender Eyes: Conjunctivae clear without exudates or hemorrhage  Neuro: Detailed Neurologic Exam  Speech:    Speech is normal; fluent and spontaneous with normal comprehension.  Cognition:    The patient is oriented to person, place, and time;     recent and remote memory intact;     language fluent;     normal attention, concentration,     fund of knowledge Cranial Nerves:    The pupils are equal, round, and reactive to light. Pupils too small to visualize fundi.. Visual fields are full to finger confrontation. Extraocular movements are intact. Trigeminal sensation is intact and the muscles of mastication are normal. The face is  symmetric. The palate elevates in the midline. Hearing intact. Voice is normal. Shoulder shrug is normal. The tongue has normal motion without fasciculations.   Coordination:    Normal   Gait:    Slightly wide based   Motor Observation:    No asymmetry, no atrophy, and no involuntary movements noted. Tone:    Normal muscle tone.    Posture:    Posture is normal. normal erect    Strength:    Strength is V/V in the upper and lower limbs.  Sensation: intact to LT, pin prick, temp, a few beats vibration at the great toes.     Reflex Exam:  DTR's:    Trace AJ. Otherwise deep tendon reflexes in the upper and lower extremities are 1-2+  bilaterally.   Toes:    The toes are downgoing bilaterally.   Clonus:    Clonus is absent.    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   Cc: Leonard Downing, *,  Leonard Downing, MD  Sarina Ill, MD  [facility] [address] [place], [place] [address]  Phone [phone] Fax [phone]  I spent over 40 minutes of face-to-face and non-face-to-face time with patient on the  1. Numbness and tingling of both feet   2. Numbness in both hands   3. Type 2 diabetes mellitus with other specified complication, without  long-term current use of insulin (HCC)    diagnosis.  This included previsit chart review, lab review, study review, order entry, electronic health record documentation, patient education on the different diagnostic and therapeutic options, counseling and coordination of care, risks and benefits of management, compliance, or risk factor reduction

## 2020-11-11 NOTE — Patient Instructions (Addendum)
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 -May consider daily alpha lipoic acid which is an antioxidant that may reduce free radical oxidative stress associated with diabetic polyneuropathy, existing evidence suggests that alpha lipoic acid significantly reduces stabbing, lancinating and burning pain and diabetic neuropathy with its onset of action as early as 1-2 weeks.   Carpal Tunnel Syndrome Carpal tunnel syndrome is a condition that causes pain, weakness, and numbness in your hand and arm. Numbness is when you cannot feel an area in your body. The carpal tunnel is a narrow area that is on the palm side of your wrist. Repeated wrist motion or certain diseases may cause swelling in the tunnel. This swelling can pinch the main nerve in the wrist. This nerve is called the median nerve. What are the causes? This condition may be caused by: Moving your hand and wrist over and over again while doing a task. Injury to the wrist. Arthritis. A sac of fluid (cyst) or abnormal growth (tumor) in the carpal tunnel. Fluid buildup during pregnancy. Use of tools that vibrate. Sometimes the cause is not known. What increases the risk? The following factors may make you more likely to have this condition: Having a job that makes you do these things: Move your hand over and over again. Work with tools that vibrate, such as drills or sanders. Being a woman. Having diabetes, obesity, thyroid problems, or kidney failure. What are the signs or symptoms? Symptoms of this condition include: A tingling feeling in your fingers. Tingling or loss of feeling in your hand. Pain in your entire arm. This pain may get worse when you bend your wrist and elbow for a long time. Pain in your wrist that goes up your arm to your shoulder. Pain that goes down into your palm  or fingers. Weakness in your hands. You may find it hard to grab and hold items. You may feel worse at night. How is this treated? This condition may be treated with: Lifestyle changes. You will be asked to stop or change the activity that caused your problem. Doing exercises and activities that make bones, muscles, and tendons stronger (physical therapy). Learning how to use your hand again (occupational therapy). Medicines for pain and swelling. You may have injections in your wrist. A wrist splint or brace. Surgery. Follow these instructions at home: If you have a splint or brace: Wear the splint or brace as told by your doctor. Take it off only as told by your doctor. Loosen the splint if your fingers: Tingle. Become numb. Turn cold and blue. Keep the splint or brace clean. If the splint or brace is not waterproof: Do not let it get wet. Cover it with a watertight covering when you take a bath or a shower. Managing pain, stiffness, and swelling If told, put ice on the painful area: If you have a removable splint or brace, remove it as told by your doctor. Put ice in a plastic bag. Place a towel between your skin and the bag. Leave the ice on for 20 minutes, 2-3 times per day. Do not fall asleep with the cold pack on your skin. Take off the ice if your skin turns bright red. This is very important. If you cannot feel pain, heat, or cold, you have a greater risk of damage to the  area. Move your fingers often to reduce stiffness and swelling. General instructions Take over-the-counter and prescription medicines only as told by your doctor. Rest your wrist from any activity that may cause pain. If needed, talk with your boss at work about changes that can help your wrist heal. Do exercises as told by your doctor, physical therapist, or occupational therapist. Keep all follow-up visits. Contact a doctor if: You have new symptoms. Medicine does not help your pain. Your symptoms  get worse. Get help right away if: You have very bad numbness or tingling in your wrist or hand. Summary Carpal tunnel syndrome is a condition that causes pain in your hand and arm. It is often caused by repeated wrist motions. Lifestyle changes and medicines are used to treat this problem. Surgery may help in very bad cases. Follow your doctor's instructions about wearing a splint, resting your wrist, keeping follow-up visits, and calling for help. This information is not intended to replace advice given to you by your health care provider. Make sure you discuss any questions you have with your health care provider. Document Revised: 07/05/2019 Document Reviewed: 07/05/2019 Elsevier Patient Education  2022 [employer]   Peripheral Neuropathy Peripheral neuropathy is a type of nerve damage. It affects nerves that carry signals between the spinal cord and the arms, legs, and the rest of the body (peripheral nerves). It does not affect nerves in the spinal cord or brain. In peripheral neuropathy, one nerve or a group of nerves may be damaged. Peripheral neuropathy is a broad category that includes many specific nerve disorders, like diabetic neuropathy, hereditary neuropathy, and carpal tunnel syndrome. What are the causes? This condition may be caused by: Diabetes. This is the most common cause of peripheral neuropathy. Nerve injury. Pressure or stress on a nerve that lasts a long time. Lack (deficiency) of B vitamins. This can result from alcoholism, poor diet, or a restricted diet. Infections. Autoimmune diseases, such as rheumatoid arthritis and systemic lupus erythematosus. Nerve diseases that are passed from parent to child (inherited). Some medicines, such as cancer medicines (chemotherapy). Poisonous (toxic) substances, such as lead and mercury. Too little blood flowing to the legs. Kidney disease. Thyroid disease. In some cases, the cause of this condition is not known. What  are the signs or symptoms? Symptoms of this condition depend on which of your nerves is damaged. Common symptoms include: Loss of feeling (numbness) in the feet, hands, or both. Tingling in the feet, hands, or both. Burning pain. Very sensitive skin. Weakness. Not being able to move a part of the body (paralysis). Muscle twitching. Clumsiness or poor coordination. Loss of balance. Not being able to control your bladder. Feeling dizzy. Sexual problems. How is this diagnosed? Diagnosing and finding the cause of peripheral neuropathy can be difficult. Your health care provider will take your medical history and do a physical exam. A neurological exam will also be done. This involves checking things that are affected by your brain, spinal cord, and nerves (nervous system). For example, your health care provider will check your reflexes, how you move, and what you can feel. You may have other tests, such as: Blood tests. Electromyogram (EMG) and nerve conduction tests. These tests check nerve function and how well the nerves are controlling the muscles. Imaging tests, such as CT scans or MRI to rule out other causes of your symptoms. Removing a small piece of nerve to be examined in a lab (nerve biopsy). Removing and examining a small amount of the  fluid that surrounds the brain and spinal cord (lumbar puncture). How is this treated? Treatment for this condition may involve: Treating the underlying cause of the neuropathy, such as diabetes, kidney disease, or vitamin deficiencies. Stopping medicines that can cause neuropathy, such as chemotherapy. Medicine to help relieve pain. Medicines may include: Prescription or over-the-counter pain medicine. Antiseizure medicine. Antidepressants. Pain-relieving patches that are applied to painful areas of skin. Surgery to relieve pressure on a nerve or to destroy a nerve that is causing pain. Physical therapy to help improve movement and  balance. Devices to help you move around (assistive devices). Follow these instructions at home: Medicines Take over-the-counter and prescription medicines only as told by your health care provider. Do not take any other medicines without first asking your health care provider. Do not drive or use heavy machinery while taking prescription pain medicine. Lifestyle  Do not use any products that contain nicotine or tobacco, such as cigarettes and e-cigarettes. Smoking keeps blood from reaching damaged nerves. If you need help quitting, ask your health care provider. Avoid or limit alcohol. Too much alcohol can cause a vitamin B deficiency, and vitamin B is needed for healthy nerves. Eat a healthy diet. This includes: Eating foods that are high in fiber, such as fresh fruits and vegetables, whole grains, and beans. Limiting foods that are high in fat and processed sugars, such as fried or sweet foods. General instructions  If you have diabetes, work closely with your health care provider to keep your blood sugar under control. If you have numbness in your feet: Check every day for signs of injury or infection. Watch for redness, warmth, and swelling. Wear padded socks and comfortable shoes. These help protect your feet. Develop a good support system. Living with peripheral neuropathy can be stressful. Consider talking with a mental health specialist or joining a support group. Use assistive devices and attend physical therapy as told by your health care provider. This may include using a walker or a cane. Keep all follow-up visits as told by your health care provider. This is important. Contact a health care provider if: You have new signs or symptoms of peripheral neuropathy. You are struggling emotionally from dealing with peripheral neuropathy. Your pain is not well-controlled. Get help right away if: You have an injury or infection that is not healing normally. You develop new weakness in  an arm or leg. You have fallen or do so frequently. Summary Peripheral neuropathy is when the nerves in the arms, or legs are damaged, resulting in numbness, weakness, or pain. There are many causes of peripheral neuropathy, including diabetes, pinched nerves, vitamin deficiencies, autoimmune disease, and hereditary conditions. Diagnosing and finding the cause of peripheral neuropathy can be difficult. Your health care provider will take your medical history, do a physical exam, and do tests, including blood tests and nerve function tests. Treatment involves treating the underlying cause of the neuropathy and taking medicines to help control pain. Physical therapy and assistive devices may also help. This information is not intended to replace advice given to you by your health care provider. Make sure you discuss any questions you have with your health care provider. Document Revised: 12/04/2019 Document Reviewed: 12/04/2019 Elsevier Patient Education  2022 [employer]  Diabetic Neuropathy Diabetic neuropathy refers to nerve damage that is caused by diabetes. Over time, people with diabetes can develop nerve damage throughout the body. There are several types of diabetic neuropathy: Peripheral neuropathy. This is the most common type of diabetic neuropathy.  It damages the nerves that carry signals between the spinal cord and other parts of the body (peripheral nerves). This usually affects nerves in the feet, legs, hands, and arms. Autonomic neuropathy. This type causes damage to nerves that control involuntary functions (autonomic nerves). Involuntary functions are functions of the body that you do not control. They include heartbeat, body temperature, blood pressure, urination, digestion, sweating, sexual function, or response to changes in blood glucose. Focal neuropathy. This type of nerve damage affects one area of the body, such as an arm, a leg, or the face. The injury may involve one nerve  or a small group of nerves. Focal neuropathy can be painful and unpredictable. It occurs most often in older adults with diabetes. This often develops suddenly, but usually improves over time and does not cause long-term problems. Proximal neuropathy. This type of nerve damage affects the nerves of the thighs, hips, buttocks, or legs. It causes severe pain, weakness, and muscle death (atrophy), usually in the thigh muscles. It is more common among older men and people who have type 2 diabetes. The length of recovery time may vary. What are the causes? Peripheral, autonomic, and focal neuropathies are caused by diabetes that is not well controlled with treatment. The cause of proximal neuropathy is not known, but it may be caused by inflammation related to uncontrolled blood glucose levels. What are the signs or symptoms? Peripheral neuropathy Peripheral neuropathy develops slowly over time. When the nerves of the feet and legs no longer work, you may experience: Burning, stabbing, or aching pain in the legs or feet. Pain or cramping in the legs or feet. Loss of feeling (numbness) and inability to feel pressure or pain in the feet. This can lead to: Thick calluses or sores on areas of constant pressure. Ulcers. Reduced ability to feel temperature changes. Foot deformities. Muscle weakness. Loss of balance or coordination. Autonomic neuropathy The symptoms of autonomic neuropathy vary depending on which nerves are affected. Symptoms may include: Problems with digestion, such as: Nausea or vomiting. Poor appetite. Bloating. Diarrhea or constipation. Trouble swallowing. Losing weight without trying to. Problems with the heart, blood, and lungs, such as: Dizziness, especially when standing up. Fainting. Shortness of breath. Irregular heartbeat. Bladder problems, such as: Trouble starting or stopping urination. Leaking urine. Trouble emptying the bladder. Urinary tract infections  (UTIs). Problems with other body functions, such as: Sweat. You may sweat too much or too little. Temperature. You might get hot easily. Or, you might feel cold more than usual. Sexual function. Men may not be able to get or maintain an erection. Women may have vaginal dryness and difficulty with arousal. Focal neuropathy Symptoms affect only one area of the body. Common symptoms include: Numbness. Tingling. Burning pain. Prickling feeling. Very sensitive skin. Weakness. Inability to move (paralysis). Muscle twitching. Muscles getting smaller (wasting). Poor coordination. Double or blurred vision. Proximal neuropathy Sudden, severe pain in the hip, thigh, or buttocks. Pain may spread from the back into the legs (sciatica). Pain and numbness in the arms and legs. Tingling. Loss of bladder control or bowel control. Weakness and wasting of thigh muscles. Difficulty getting up from a seated position. Abdominal swelling. Unexplained weight loss. How is this diagnosed? Diagnosis varies depending on the type of neuropathy your health care provider suspects. Peripheral neuropathy Your health care provider will do a neurologic exam. This exam checks your reflexes, how you move, and what you can feel. You may have other tests, such as: Blood tests. Tests of the  fluid that surrounds the spinal cord (lumbar puncture). CT scan. MRI. Checking the nerves that control muscles (electromyogram, or EMG). Checking how quickly signals pass through your nerves (nerve conduction study). Checking a small piece of a nerve using a microscope (biopsy). Autonomic neuropathy You may have tests, such as: Tests to measure your blood pressure and heart rate. You may be secured to an exam table that moves you from a lying position to an upright position (table tilt test). Breathing tests to check your lungs. Tests to check how food moves through the digestive system (gastric emptying tests). Blood, sweat,  or urine tests. Ultrasound of your bladder. Spinal fluid tests. Focal neuropathy This condition may be diagnosed with: A neurologic exam. CT scan. MRI. EMG. Nerve conduction study. Proximal neuropathy There is no test to diagnose this type of neuropathy. You may have tests to rule out other possible causes of this type of neuropathy. Tests may include: X-rays of your spine and lumbar region. Lumbar puncture. MRI. How is this treated? The goal of treatment is to keep nerve damage from getting worse. Treatment may include: Following your diabetes management plan. This will help keep your blood glucose level and your A1C level within your target range. This is the most important treatment. Using prescription pain medicine. Follow these instructions at home: Diabetes management Follow your diabetes management plan as told by your health care provider. Check your blood glucose levels. Keep your blood glucose in your target range. Have your A1C level checked at least two times a year, or as often as told. Take over the counter and prescription medicines only as told by your health care provider. This includes insulin and diabetes medicine.  Lifestyle  Do not use any products that contain nicotine or tobacco, such as cigarettes, e-cigarettes, and chewing tobacco. If you need help quitting, ask your health care provider. Be physically active every day. Include strength training and balance exercises. Follow a healthy meal plan. Work with your health care provider to manage your blood pressure. General instructions Ask your health care provider if the medicine prescribed to you requires you to avoid driving or using machinery. Check your skin and feet every day for cuts, bruises, redness, blisters, or sores. Keep all follow-up visits. This is important. Contact a health care provider if: You have burning, stabbing, or aching pain in your legs or feet. You are unable to feel pressure or  pain in your feet. You develop problems with digestion, such as: Nausea. Vomiting. Bloating. Constipation. Diarrhea. Abdominal pain. You have difficulty with urination, such as: Inability to control when you urinate (incontinence). Inability to completely empty the bladder (retention). You feel as if your heart is racing (palpitations). You feel dizzy, weak, or faint when you stand up. Get help right away if: You cannot urinate. You have sudden weakness or loss of coordination. You have trouble speaking. You have pain or pressure in your chest. You have an irregular heartbeat. You have sudden inability to move a part of your body. These symptoms may represent a serious problem that is an emergency. Do not wait to see if the symptoms will go away. Get medical help right away. Call your local emergency services (911 in the [place] Do not drive yourself to the hospital. Summary Diabetic neuropathy is nerve damage that is caused by diabetes. It can cause numbness and pain in the arms, legs, digestive tract, heart, and other body systems. This condition is treated by keeping your blood glucose level and your  A1C level within your target range. This can help prevent neuropathy from getting worse. Check your skin and feet every day for cuts, bruises, redness, blisters, or sores. Do not use any products that contain nicotine or tobacco, such as cigarettes, e-cigarettes, and chewing tobacco. This information is not intended to replace advice given to you by your health care provider. Make sure you discuss any questions you have with your health care provider. Document Revised: 07/05/2019 Document Reviewed: 07/05/2019 Elsevier Patient Education  2022 [employer]

## 2020-11-17 ENCOUNTER — Other Ambulatory Visit: Payer: Self-pay | Admitting: Neurology

## 2020-11-17 DIAGNOSIS — R200 Anesthesia of skin: Secondary | ICD-10-CM

## 2020-11-18 LAB — MULTIPLE MYELOMA PANEL, SERUM
Albumin SerPl Elph-Mcnc: 4 g/dL (ref 2.9–4.4)
Albumin/Glob SerPl: 1.4 (ref 0.7–1.7)
Alpha 1: 0.3 g/dL (ref 0.0–0.4)
Alpha2 Glob SerPl Elph-Mcnc: 0.9 g/dL (ref 0.4–1.0)
B-Globulin SerPl Elph-Mcnc: 1 g/dL (ref 0.7–1.3)
Gamma Glob SerPl Elph-Mcnc: 0.7 g/dL (ref 0.4–1.8)
Globulin, Total: 2.9 g/dL (ref 2.2–3.9)
Total Protein: 6.9 g/dL (ref 6.0–8.5)

## 2020-11-18 LAB — HEMOGLOBIN A1C
Est. average glucose Bld gHb Est-mCnc: 140 mg/dL
Hgb A1c MFr Bld: 6.5 % — ABNORMAL HIGH (ref 4.8–5.6)

## 2020-11-18 LAB — HEAVY METALS, BLOOD
Arsenic: <1 ug/L (ref 0–9)
Lead, Blood: <1 ug/dL (ref 0–4)
Mercury: <1 ug/L (ref 0.0–14.9)

## 2020-11-18 LAB — B12 AND FOLATE PANEL

## 2020-11-18 LAB — VITAMIN B6: Vitamin B6: 36.1 ug/L (ref 3.4–65.2)

## 2020-11-18 LAB — METHYLMALONIC ACID, SERUM

## 2020-11-18 LAB — VITAMIN B1

## 2020-11-19 ENCOUNTER — Telehealth: Payer: Self-pay | Admitting: *Deleted

## 2020-11-19 ENCOUNTER — Other Ambulatory Visit: Payer: Self-pay | Admitting: Neurology

## 2020-11-19 DIAGNOSIS — G629 Polyneuropathy, unspecified: Secondary | ICD-10-CM

## 2020-11-19 NOTE — Telephone Encounter (Signed)
-----   Message from Anson Fret, MD sent at 11/17/2020  7:06 PM EDT ----- Please call patient: HgbA1c in the diabetic range 6.5. Even though it is not significntly elevated, even this can cause neuropathy in the feet. Unfortunately his B12, folate, thiamine and MMA were canceled by the lab so I would like him to get those retaken maybe before his emg/ncs. Can you also mention it to lab and see why this keeps happening lately? Still awaiting some blood work. thanks

## 2020-11-19 NOTE — Telephone Encounter (Signed)
Called patient and LVM (ok per DPR) advising his HgbA1c is in the diabetic range at 6.5.  Even though this is not a significant increase, it can still cause neuropathy in the feet.  I advised the patient to get with his primary care to discuss any necessary treatment for this.  Left office number in message for call back.  Labs sent to Dr Jeannetta Nap.

## 2020-11-20 NOTE — Telephone Encounter (Signed)
Called pt and LVM (ok per DPR) advising that there were a few labs that were canceled and were unable to be resulted.  I have asked the patient to please have the labs drawn here at the office right before his EMG/NCV on 12/22/20. Left office number in message for call back.

## 2020-12-11 ENCOUNTER — Ambulatory Visit: Payer: Medicare Other | Admitting: Neurology

## 2020-12-11 ENCOUNTER — Ambulatory Visit (INDEPENDENT_AMBULATORY_CARE_PROVIDER_SITE_OTHER): Payer: Medicare Other | Admitting: Neurology

## 2020-12-11 DIAGNOSIS — G608 Other hereditary and idiopathic neuropathies: Secondary | ICD-10-CM

## 2020-12-11 DIAGNOSIS — G629 Polyneuropathy, unspecified: Secondary | ICD-10-CM

## 2020-12-11 DIAGNOSIS — M5441 Lumbago with sciatica, right side: Secondary | ICD-10-CM

## 2020-12-11 DIAGNOSIS — R29898 Other symptoms and signs involving the musculoskeletal system: Secondary | ICD-10-CM

## 2020-12-11 DIAGNOSIS — R202 Paresthesia of skin: Secondary | ICD-10-CM

## 2020-12-11 DIAGNOSIS — Z0289 Encounter for other administrative examinations: Secondary | ICD-10-CM

## 2020-12-11 DIAGNOSIS — G5603 Carpal tunnel syndrome, bilateral upper limbs: Secondary | ICD-10-CM

## 2020-12-11 DIAGNOSIS — R200 Anesthesia of skin: Secondary | ICD-10-CM

## 2020-12-11 DIAGNOSIS — M5416 Radiculopathy, lumbar region: Secondary | ICD-10-CM

## 2020-12-11 DIAGNOSIS — M79604 Pain in right leg: Secondary | ICD-10-CM

## 2020-12-11 DIAGNOSIS — M79605 Pain in left leg: Secondary | ICD-10-CM

## 2020-12-11 DIAGNOSIS — M5442 Lumbago with sciatica, left side: Secondary | ICD-10-CM

## 2020-12-11 DIAGNOSIS — G8929 Other chronic pain: Secondary | ICD-10-CM

## 2020-12-11 MED ORDER — GABAPENTIN 300 MG PO CAPS: 300.0000 mg | ORAL_CAPSULE | Freq: Three times a day (TID) | ORAL | 3 refills | Status: DC

## 2020-12-11 NOTE — Patient Instructions (Addendum)
Dr. Amanda Pea - [facility] for Carpal Tunnel Syndrome MRI Lumbar spine and referral to Dr. Ethelene Hal at [facility] for L5/S1 injections  Carpal Tunnel Braces: can get anywhere(amazon, target, [employer] et) but here is medical supply store nearby as well Address: [address], [place], [place] [address] Areas served: [phone] Hours:  Open ? Closes 5:30PM Phone: [phone]  Carpal Tunnel Syndrome Carpal tunnel syndrome is a condition that causes pain, numbness, and weakness in your hand and fingers. The carpal tunnel is a narrow area located on the palm side of your wrist. Repeated wrist motion or certain diseases may cause swelling within the tunnel. This swelling pinches the main nerve in the wrist. The main nerve in the wrist is called the median nerve. What are the causes? This condition may be caused by: Repeated and forceful wrist and hand motions. Wrist injuries. Arthritis. A cyst or tumor in the carpal tunnel. Fluid buildup during pregnancy. Use of tools that vibrate. Sometimes the cause of this condition is not known. What increases the risk? The following factors may make you more likely to develop this condition: Having a job that requires you to repeatedly or forcefully move your wrist or hand or requires you to use tools that vibrate. This may include jobs that involve using computers, working on an [employer], or working with power tools such as Radiographer, therapeutic. Being a woman. Having certain conditions, such as: Diabetes. Obesity. An underactive thyroid (hypothyroidism). Kidney failure. Rheumatoid arthritis. What are the signs or symptoms? Symptoms of this condition include: A tingling feeling in your fingers, especially in your thumb, index, and middle fingers. Tingling or numbness in your hand. An aching feeling in your entire arm, especially when your wrist and elbow are bent for a long time. Wrist pain that goes up your arm to your shoulder. Pain that  goes down into your palm or fingers. A weak feeling in your hands. You may have trouble grabbing and holding items. Your symptoms may feel worse during the night. How is this diagnosed? This condition is diagnosed with a medical history and physical exam. You may also have tests, including: Electromyogram (EMG). This test measures electrical signals sent by your nerves into the muscles. Nerve conduction study. This test measures how well electrical signals pass through your nerves. Imaging tests, such as X-rays, ultrasound, and MRI. These tests check for possible causes of your condition. How is this treated? This condition may be treated with: Lifestyle changes. It is important to stop or change the activity that caused your condition. Doing exercise and activities to strengthen and stretch your muscles and tendons (physical therapy). Making lifestyle changes to help with your condition and learning how to do your daily activities safely (occupational therapy). Medicines for pain and inflammation. This may include medicine that is injected into your wrist. A wrist splint or brace. Surgery. Follow these instructions at home: If you have a splint or brace: Wear the splint or brace as told by your health care provider. Remove it only as told by your health care provider. Loosen the splint or brace if your fingers tingle, become numb, or turn cold and blue. Keep the splint or brace clean. If the splint or brace is not waterproof: Do not let it get wet. Cover it with a watertight covering when you take a bath or shower. Managing pain, stiffness, and swelling If directed, put ice on the painful area. To do this: If you have a removeable splint or  brace, remove it as told by your health care provider. Put ice in a plastic bag. Place a towel between your skin and the bag or between the splint or brace and the bag. Leave the ice on for 20 minutes, 2-3 times a day. Do not fall asleep with the  cold pack on your skin. Remove the ice if your skin turns bright red. This is very important. If you cannot feel pain, heat, or cold, you have a greater risk of damage to the area. Move your fingers often to reduce stiffness and swelling. General instructions Take over-the-counter and prescription medicines only as told by your health care provider. Rest your wrist and hand from any activity that may be causing your pain. If your condition is work related, talk with your employer about changes that can be made, such as getting a wrist pad to use while typing. Do any exercises as told by your health care provider, physical therapist, or occupational therapist. Keep all follow-up visits. This is important. Contact a health care provider if: You have new symptoms. Your pain is not controlled with medicines. Your symptoms get worse. Get help right away if: You have severe numbness or tingling in your wrist or hand. Summary Carpal tunnel syndrome is a condition that causes pain, numbness, and weakness in your hand and fingers. It is usually caused by repeated wrist motions. Lifestyle changes and medicines are used to treat carpal tunnel syndrome. Surgery may be recommended. Follow your health care provider's instructions about wearing a splint, resting from activity, keeping follow-up visits, and calling for help. This information is not intended to replace advice given to you by your health care provider. Make sure you discuss any questions you have with your health care provider. Document Revised: 07/05/2019 Document Reviewed: 07/05/2019 Elsevier Patient Education  2022 Elsevier Inc.  Sciatica Sciatica is pain, numbness, weakness, or tingling along the path of the sciatic nerve. The sciatic nerve starts in the lower back and runs down the back of each leg. The nerve controls the muscles in the lower leg and in the back of the knee. It also provides feeling (sensation) to the back of the thigh,  the lower leg, and the sole of the foot. Sciatica is a symptom of another medical condition that pinches or puts pressure on the sciatic nerve. Sciatica most often only affects one side of the body. Sciatica usually goes away on its own or with treatment. In some cases, sciatica may come back (recur). What are the causes? This condition is caused by pressure on the sciatic nerve or pinching of the nerve. This may be the result of: A disk in between the bones of the spine bulging out too far (herniated disk). Age-related changes in the spinal disks. A pain disorder that affects a muscle in the buttock. Extra bone growth near the sciatic nerve. A break (fracture) of the pelvis. Pregnancy. Tumor. This is rare. What increases the risk? The following factors may make you more likely to develop this condition: Playing sports that place pressure or stress on the spine. Having poor strength and flexibility. A history of back injury or surgery. Sitting for long periods of time. Doing activities that involve repetitive bending or lifting. Obesity. What are the signs or symptoms? Symptoms can vary from mild to very severe, and they may include: Any of these problems in the lower back, leg, hip, or buttock: Mild tingling, numbness, or dull aches. Burning sensations. Sharp pains. Numbness in the back of  the calf or the sole of the foot. Leg weakness. Severe back pain that makes movement difficult. Symptoms may get worse when you cough, sneeze, or laugh, or when you sit or stand for long periods of time. How is this diagnosed? This condition may be diagnosed based on: Your symptoms and medical history. A physical exam. Blood tests. Imaging tests, such as: X-rays. MRI. CT scan. How is this treated? In many cases, this condition improves on its own without treatment. However, treatment may include: Reducing or modifying physical activity. Exercising and stretching. Icing and applying heat  to the affected area. Medicines that help to: Relieve pain and swelling. Relax your muscles. Injections of medicines that help to relieve pain, irritation, and inflammation around the sciatic nerve (steroids). Surgery. Follow these instructions at home: Medicines Take over-the-counter and prescription medicines only as told by your health care provider. Ask your health care provider if the medicine prescribed to you: Requires you to avoid driving or using heavy machinery. Can cause constipation. You may need to take these actions to prevent or treat constipation: Drink enough fluid to keep your urine pale yellow. Take over-the-counter or prescription medicines. Eat foods that are high in fiber, such as beans, whole grains, and fresh fruits and vegetables. Limit foods that are high in fat and processed sugars, such as fried or sweet foods. Managing pain   If directed, put ice on the affected area. Put ice in a plastic bag. Place a towel between your skin and the bag. Leave the ice on for 20 minutes, 2-3 times a day. If directed, apply heat to the affected area. Use the heat source that your health care provider recommends, such as a moist heat pack or a heating pad. Place a towel between your skin and the heat source. Leave the heat on for 20-30 minutes. Remove the heat if your skin turns bright red. This is especially important if you are unable to feel pain, heat, or cold. You may have a greater risk of getting burned. Activity  Return to your normal activities as told by your health care provider. Ask your health care provider what activities are safe for you. Avoid activities that make your symptoms worse. Take brief periods of rest throughout the day. When you rest for longer periods, mix in some mild activity or stretching between periods of rest. This will help to prevent stiffness and pain. Avoid sitting for long periods of time without moving. Get up and move around at least  one time each hour. Exercise and stretch regularly, as told by your health care provider. Do not lift anything that is heavier than 10 lb (4.5 kg) while you have symptoms of sciatica. When you do not have symptoms, you should still avoid heavy lifting, especially repetitive heavy lifting. When you lift objects, always use proper lifting technique, which includes: Bending your knees. Keeping the load close to your body. Avoiding twisting. General instructions Maintain a healthy weight. Excess weight puts extra stress on your back. Wear supportive, comfortable shoes. Avoid wearing high heels. Avoid sleeping on a mattress that is too soft or too hard. A mattress that is firm enough to support your back when you sleep may help to reduce your pain. Keep all follow-up visits as told by your health care provider. This is important. Contact a health care provider if: You have pain that: Wakes you up when you are sleeping. Gets worse when you lie down. Is worse than you have experienced in the past.  Lasts longer than 4 weeks. You have an unexplained weight loss. Get help right away if: You are not able to control when you urinate or have bowel movements (incontinence). You have: Weakness in your lower back, pelvis, buttocks, or legs that gets worse. Redness or swelling of your back. A burning sensation when you urinate. Summary Sciatica is pain, numbness, weakness, or tingling along the path of the sciatic nerve. This condition is caused by pressure on the sciatic nerve or pinching of the nerve. Sciatica can cause pain, numbness, or tingling in the lower back, legs, hips, and buttocks. Treatment often includes rest, exercise, medicines, and applying ice or heat. This information is not intended to replace advice given to you by your health care provider. Make sure you discuss any questions you have with your health care provider. Document Revised: 03/13/2018 Document Reviewed:  03/13/2018 Elsevier Patient Education  2022 [employer].

## 2020-12-14 DIAGNOSIS — M5416 Radiculopathy, lumbar region: Secondary | ICD-10-CM | POA: Insufficient documentation

## 2020-12-14 DIAGNOSIS — G5603 Carpal tunnel syndrome, bilateral upper limbs: Secondary | ICD-10-CM | POA: Insufficient documentation

## 2020-12-14 DIAGNOSIS — G608 Other hereditary and idiopathic neuropathies: Secondary | ICD-10-CM | POA: Insufficient documentation

## 2020-12-14 NOTE — Progress Notes (Signed)
Full Name: [name] Gender: Male MRN #: [id number] Date of Birth: 03-04-1941    Visit Date: 12/11/2020 07:33 Age: 80 Years Examining Physician: Naomie Dean, MD  Requesting Provider: Kaleen Mask, * Primary Care Provider:  Kaleen Mask, MD  History: Patient with numbness and tingling in his hands likely CTS. 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  However he has recently reported symptoms more consistent with lumbar radiculopathy in addition to the above.    Summary: EMG/nerve conduction studies were performed on the bilateral upper extremities and right lower extremity.  The right median motor nerve showed delayed distal onset latency (5.5 ms, normal less than 4.4).  The left median motor nerve showed delayed distal onset latency (8 ms, normal less than 4.4).  The left median motor nerve exhibits a Martin-Gruber anastomosis and, taking that into account, showed normal amplitude.  The right superficial peroneal sensory nerve showed reduced amplitude (4 V, normal greater than 6).  The right median and left median orthodromic sensory nerve showed no response. All remaining nerves (as indicated in the following tables) were within normal limits.  EMG needle study showed the left opponens pollicis and right opponens pollicis both had increased spontaneous activity and diminished motor unit recruitment.  The left lumbar paraspinals showed increased spontaneous activity.All remaining muscles (as indicated in the following tables) were within normal limits.     Conclusion:  1.  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  No evidence for cervical radiculopathy.  Referral to orthopedics for surgical intervention.  In the meantime conservative treatment with wrist  splints.  2.  There is acute/ongoing denervation of the left lower lumbar paraspinal muscles and left biceps femoris muscle consistent with L5-S1 current lumbar radiculopathy.  Recommend MRI of the lumbar spine, ordered. 3.  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  Follow-up with primary care for diabetic control.    ------------------------------- Naomie Dean M.D.  CC: Dr. Jeannetta Nap, [facility] [address] [place], [place] [address] Tel: [phone] Fax: [phone]  Verbal informed consent was obtained from the patient, patient was informed of potential risk of procedure, including bruising, bleeding, hematoma formation, infection, muscle weakness, muscle pain, numbness, among others.        MNC    Nerve / Sites Muscle Latency Ref. Amplitude Ref. Rel Amp Segments Distance Velocity Ref. Area    ms ms mV mV %  cm m/s m/s mVms  R Median - APB     Wrist APB 5.5 ?4.4 6.7 ?4.0 100 Wrist - APB 7   21.3     Upper arm APB 10.3  6.2  93.3 Upper arm - Wrist 24 51 ?49 20.9  L Median - APB     Wrist APB 8.0 ?4.4 0.9 ?4.0 100 Wrist - APB 7   4.0     Upper arm APB 11.9  2.3  273 Upper arm - Wrist 23 59 ?49 14.6     Ulnar Wrist APB 3.0  5.1  217 Ulnar Wrist - APB    12.8     Ulnar B. Elbow APB 7.3  4.9  97.6 Ulnar B. Elbow - APB    14.9  Ulnar A. Elbow APB 9.4  4.7  94.2 Ulnar A. Elbow - Ulnar B. Elbow    14.2  R Ulnar - ADM     Wrist ADM 3.0 ?3.3 8.5 ?6.0 100 Wrist - ADM 7   22.3     B.Elbow ADM 7.1  6.7  78.7 B.Elbow - Wrist 20 49 ?49 19.1     A.Elbow ADM 9.1  6.6  98.5 A.Elbow - B.Elbow 10 49 ?49 20.7  R Peroneal - EDB     Ankle EDB 5.2 ?6.5 3.8 ?2.0 100 Ankle - EDB 9   12.9     Fib head EDB 12.1  3.2  85.5 Fib head - Ankle 30 44 ?44 12.5     Pop fossa EDB 14.3  3.1  95.9 Pop fossa - Fib head 10 44 ?44 12.3         Pop fossa -  Ankle                 SNC    Nerve / Sites Rec. Site Peak Lat Ref.  Amp Ref. Segments Distance    ms ms V V  cm  R Superficial peroneal - Ankle     Lat leg Ankle 4.1 ?4.4 4 ?6 Lat leg - Ankle 14  R Median - Orthodromic (Dig II, Mid palm)     Dig II Wrist NR ?3.4 NR ?10 Dig II - Wrist 13  L Median - Orthodromic (Dig II, Mid palm)     Dig II Wrist NR ?3.4 NR ?10 Dig II - Wrist 13  R Ulnar - Orthodromic, (Dig V, Mid palm)     Dig V Wrist 2.9 ?3.1 6 ?5 Dig V - Wrist 11  L Ulnar - Orthodromic, (Dig V, Mid palm)     Dig V Wrist 2.6 ?3.1 6 ?5 Dig V - Wrist 59               F  Wave    Nerve F Lat Ref.   ms ms  R Ulnar - ADM 31.9 ?32.0       EMG Summary Table    Spontaneous MUAP Recruitment  Muscle IA Fib PSW Fasc Other Amp Dur. Poly Pattern  L. Deltoid Normal None None None _______ Normal Normal Normal Normal  R. Deltoid Normal None None None _______ Normal Normal Normal Normal  L. Triceps brachii Normal None None None _______ Normal Normal Normal Normal  R. Triceps brachii Normal None None None _______ Normal Normal Normal Normal  L. Lumbar paraspinals (low) Normal None 1+ None _______ Normal Normal Normal Normal  R. Lumbar paraspinals (low) Normal None None None _______ Normal Normal Normal Normal  L. Pronator teres Normal None None None _______ Normal Normal Normal Normal  R. Pronator teres Normal None None None _______ Normal Normal Normal Normal  L. First dorsal interosseous Normal None None None _______ Normal Normal Normal Normal  R. First dorsal interosseous Normal None None None _______ Normal Normal Normal Normal  L. Opponens pollicis Normal None 3+ None _______ Normal Normal Normal Reduced  R. Opponens pollicis Normal None 3+ None _______ Normal Normal Normal Reduced  L. Vastus medialis Normal None None None _______ Normal Normal Normal Normal  L. Tibialis anterior Normal None None None _______ Normal Normal Normal Normal  L. Gastrocnemius (Medial head) Normal None None  None _______ Normal Normal Normal Normal  L. Biceps femoris (long head) Normal None 2+ None _______ Normal Normal Normal Normal  L. Gluteus maximus Normal  None None None _______ Normal Normal Normal Normal  L. Gluteus medius Normal None None None _______ Normal Normal Normal Normal

## 2020-12-14 NOTE — Progress Notes (Signed)
History:  Patient with numbness and tingling in his hands likely CTS. 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  However he has recently reported symptoms more consistent with lumbar radiculopathy in addition to the above.  After the EMG nerve conduction study I discussed the below with patient.  Severe carpal tunnel syndrome: I reviewed results of EMG nerve conduction study with patient today, discussed carpal tunnel syndrome, pathophysiology, conservative or interventional/surgical options.  Considering there is acute ongoing denervation in a distal median muscle I recommend surgical intervention and discussed that carpal tunnel syndrome can be serious and cause permanent hand weakness and numbness.  I recommend Dr. Amedeo Plenty at [facility] who is an excellent physician with a wonderful bedside manner, patient is okay with waiting several months because he would like to finish a project prior to surgical intervention. (Numbness and tingling in both hands, severe carpal tunnel syndrome diagnosed by EMG nerve conduction study.  Please include my office visit, EMG nerve conduction report and data, and also my additional notes on same day as EMG nerve conduction study summarizing patient's plan and results to Dr. Amedeo Plenty)  Distal mild sensory polyneuropathy: Stable as compared to July 2020 EMG nerve conduction study.  We reviewed blood work including B1, methylmalonic acid, folate, multiple myeloma panel, B6, heavy metals, B12 and hemoglobin A1c.  Still pending B1 and methylmalonic acid.  Hemoglobin A1c was 6.5.  B12 and folate appears to not have been drawn (canceled) but we can reorder both if methylmalonic acid is abnormal.  Lumbar radiculopathy: Acute and ongoing denervation found in lumbar paraspinal muscles, will order an MRI of the lumbar spine and pending results will possibly send him to Dr. Nelva Bush for injections.   Personally reviewed images of DG lumbar spine March 07, 2015 and agree with findings: FINDINGS: Degenerative spurring throughout the lumbar spine. Slight disc space narrowing throughout the lumbar spine. Normal alignment. No fracture. SI joints are symmetric and unremarkable.  IMPRESSION: Mild diffuse degenerative disc disease.  No acute findings.  EMG/NCS results: 1.  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  No evidence for cervical radiculopathy.  Referral to orthopedics for surgical intervention.  In the meantime conservative treatment with wrist splints.  2.  There is acute/ongoing denervation of the left lower lumbar paraspinal muscles and left biceps femoris muscle consistent with L5-S1 current lumbar radiculopathy.  Recommend MRI of the lumbar spine, ordered. 3.  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  Follow-up with primary care for diabetic control (Hgba1c 6.5)  Orders Placed This Encounter  Procedures   MR LUMBAR SPINE WO CONTRAST   Ambulatory referral to Orthopedic Surgery    I spent over 30 minutes of face-to-face and non-face-to-face time with patient on the  1. Bilateral carpal tunnel syndrome   2. Lumbar radiculopathy   3. 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 Bilateral leg numbness   6. Leg weakness, bilateral   7. Pain in both lower extremities    diagnosis.  This included previsit chart review, lab review, study review, order entry, electronic health record documentation, patient education on the different diagnostic and therapeutic options, counseling and coordination of care, risks and  benefits of management, compliance, or risk factor reduction.  This  does not include time spent on EMG nerve conduction study.

## 2020-12-14 NOTE — Progress Notes (Signed)
See procedure note.

## 2020-12-14 NOTE — Procedures (Signed)
      Full Name: [name] Gender: Male MRN #: [id number] Date of Birth: 10/10/1940    Visit Date: 12/11/2020 07:33 Age: 80 Years Examining Physician: Julias Mould, MD  Requesting Provider: Elkins, Wilson Oliver, * Primary Care Provider:  Elkins, Wilson Oliver, MD  History: Patient with numbness and tingling in his hands likely CTS. 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  However he has recently reported symptoms more consistent with lumbar radiculopathy in addition to the above.    Summary: EMG/nerve conduction studies were performed on the bilateral upper extremities and right lower extremity.  The right median motor nerve showed delayed distal onset latency (5.5 ms, normal less than 4.4).  The left median motor nerve showed delayed distal onset latency (8 ms, normal less than 4.4).  The left median motor nerve exhibits a Martin-Gruber anastomosis and, taking that into account, showed normal amplitude.  The right superficial peroneal sensory nerve showed reduced amplitude (4 V, normal greater than 6).  The right median and left median orthodromic sensory nerve showed no response. All remaining nerves (as indicated in the following tables) were within normal limits.  EMG needle study showed the left opponens pollicis and right opponens pollicis both had increased spontaneous activity and diminished motor unit recruitment.  The left lumbar paraspinals showed increased spontaneous activity.All remaining muscles (as indicated in the following tables) were within normal limits.     Conclusion:  1.  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  No evidence for cervical radiculopathy.  Referral to orthopedics for surgical intervention.  In the meantime conservative treatment with wrist  splints.  2.  There is acute/ongoing denervation of the left lower lumbar paraspinal muscles and left biceps femoris muscle consistent with L5-S1 current lumbar radiculopathy.  Recommend MRI of the lumbar spine, ordered. 3.  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  Follow-up with primary care for diabetic control.    ------------------------------- Lissandro Dilorenzo M.D.  CC: Dr. Elkins, Dr. Gramig  [facility] [address], [address] [place], [place] [address] Tel: [phone] Fax: [phone]  Verbal informed consent was obtained from the patient, patient was informed of potential risk of procedure, including bruising, bleeding, hematoma formation, infection, muscle weakness, muscle pain, numbness, among others.        MNC    Nerve / Sites Muscle Latency Ref. Amplitude Ref. Rel Amp Segments Distance Velocity Ref. Area    ms ms mV mV %  cm m/s m/s mVms  R Median - APB     Wrist APB 5.5 ?4.4 6.7 ?4.0 100 Wrist - APB 7   21.3     Upper arm APB 10.3  6.2  93.3 Upper arm - Wrist 24 51 ?49 20.9  L Median - APB     Wrist APB 8.0 ?4.4 0.9 ?4.0 100 Wrist - APB 7   4.0     Upper arm APB 11.9  2.3  273 Upper arm - Wrist 23 59 ?49 14.6     Ulnar Wrist APB 3.0  5.1  217 Ulnar Wrist - APB    12.8     Ulnar B. Elbow APB 7.3  4.9  97.6 Ulnar B. Elbow - APB    14.9       Ulnar A. Elbow APB 9.4  4.7  94.2 Ulnar A. Elbow - Ulnar B. Elbow    14.2  R Ulnar - ADM     Wrist ADM 3.0 ?3.3 8.5 ?6.0 100 Wrist - ADM 7   22.3     B.Elbow ADM 7.1  6.7  78.7 B.Elbow - Wrist 20 49 ?49 19.1     A.Elbow ADM 9.1  6.6  98.5 A.Elbow - B.Elbow 10 49 ?49 20.7  R Peroneal - EDB     Ankle EDB 5.2 ?6.5 3.8 ?2.0 100 Ankle - EDB 9   12.9     Fib head EDB 12.1  3.2  85.5 Fib head - Ankle 30 44 ?44 12.5     Pop fossa EDB 14.3  3.1  95.9 Pop fossa - Fib head 10 44 ?44 12.3         Pop fossa -  Ankle                 SNC    Nerve / Sites Rec. Site Peak Lat Ref.  Amp Ref. Segments Distance    ms ms V V  cm  R Superficial peroneal - Ankle     Lat leg Ankle 4.1 ?4.4 4 ?6 Lat leg - Ankle 14  R Median - Orthodromic (Dig II, Mid palm)     Dig II Wrist NR ?3.4 NR ?10 Dig II - Wrist 13  L Median - Orthodromic (Dig II, Mid palm)     Dig II Wrist NR ?3.4 NR ?10 Dig II - Wrist 13  R Ulnar - Orthodromic, (Dig V, Mid palm)     Dig V Wrist 2.9 ?3.1 6 ?5 Dig V - Wrist 11  L Ulnar - Orthodromic, (Dig V, Mid palm)     Dig V Wrist 2.6 ?3.1 6 ?5 Dig V - Wrist 11               F  Wave    Nerve F Lat Ref.   ms ms  R Ulnar - ADM 31.9 ?32.0       EMG Summary Table    Spontaneous MUAP Recruitment  Muscle IA Fib PSW Fasc Other Amp Dur. Poly Pattern  L. Deltoid Normal None None None _______ Normal Normal Normal Normal  R. Deltoid Normal None None None _______ Normal Normal Normal Normal  L. Triceps brachii Normal None None None _______ Normal Normal Normal Normal  R. Triceps brachii Normal None None None _______ Normal Normal Normal Normal  L. Lumbar paraspinals (low) Normal None 1+ None _______ Normal Normal Normal Normal  R. Lumbar paraspinals (low) Normal None None None _______ Normal Normal Normal Normal  L. Pronator teres Normal None None None _______ Normal Normal Normal Normal  R. Pronator teres Normal None None None _______ Normal Normal Normal Normal  L. First dorsal interosseous Normal None None None _______ Normal Normal Normal Normal  R. First dorsal interosseous Normal None None None _______ Normal Normal Normal Normal  L. Opponens pollicis Normal None 3+ None _______ Normal Normal Normal Reduced  R. Opponens pollicis Normal None 3+ None _______ Normal Normal Normal Reduced  L. Vastus medialis Normal None None None _______ Normal Normal Normal Normal  L. Tibialis anterior Normal None None None _______ Normal Normal Normal Normal  L. Gastrocnemius (Medial head) Normal None None  None _______ Normal Normal Normal Normal  L. Biceps femoris (long head) Normal None 2+ None _______ Normal Normal Normal Normal  L. Gluteus maximus Normal   None None None _______ Normal Normal Normal Normal  L. Gluteus medius Normal None None None _______ Normal Normal Normal Normal

## 2020-12-15 ENCOUNTER — Telehealth: Payer: Self-pay | Admitting: Neurology

## 2020-12-15 NOTE — Telephone Encounter (Signed)
[employer] media[facility] order sent to [facility], [facility] they will reach out to the patient to schedule.

## 2020-12-16 ENCOUNTER — Telehealth: Payer: Self-pay | Admitting: Neurology

## 2020-12-16 NOTE — Telephone Encounter (Signed)
Sent to [facility] for Dr. William Gramig ph # [phone]. 

## 2020-12-18 ENCOUNTER — Other Ambulatory Visit: Payer: Self-pay

## 2020-12-18 ENCOUNTER — Ambulatory Visit
Admission: RE | Admit: 2020-12-18 | Discharge: 2020-12-18 | Disposition: A | Discharge status: Home / Self Care | Payer: Medicare Other | Source: Ambulatory Visit | Attending: Neurology | Admitting: Neurology

## 2020-12-18 DIAGNOSIS — M5416 Radiculopathy, lumbar region: Secondary | ICD-10-CM | POA: Diagnosis not present

## 2020-12-18 DIAGNOSIS — M5442 Lumbago with sciatica, left side: Secondary | ICD-10-CM

## 2020-12-18 DIAGNOSIS — M79605 Pain in left leg: Secondary | ICD-10-CM

## 2020-12-18 DIAGNOSIS — R29898 Other symptoms and signs involving the musculoskeletal system: Secondary | ICD-10-CM

## 2020-12-18 DIAGNOSIS — M79604 Pain in right leg: Secondary | ICD-10-CM

## 2020-12-18 DIAGNOSIS — G8929 Other chronic pain: Secondary | ICD-10-CM

## 2020-12-18 DIAGNOSIS — R200 Anesthesia of skin: Secondary | ICD-10-CM

## 2020-12-18 LAB — FOLATE: Folate: 11.8 ng/mL (ref >3.0)

## 2020-12-18 LAB — METHYLMALONIC ACID, SERUM: Methylmalonic Acid: 127 nmol/L (ref 0–378)

## 2020-12-18 LAB — VITAMIN B1: Thiamine: 153.6 nmol/L (ref 66.5–200.0)

## 2020-12-19 ENCOUNTER — Other Ambulatory Visit: Payer: Self-pay | Admitting: Neurology

## 2020-12-19 ENCOUNTER — Telehealth: Payer: Self-pay | Admitting: Neurology

## 2020-12-19 NOTE — Telephone Encounter (Signed)
Please call patient and let me know where he wants to be referred to:  Mr. 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 At one level the main lumbar canal is severely narrowed("Severe spinal stenosis") which can cause lots of pain and weakness and numbness/tingling/burning. You have several levels where the nerves are pinched("nerve root compression") which causes shooting pain down the legs and weakness. There is a lot going on that may need to be addressed by a surgeon. I would like to send you to [facility] unless you have a specific neurosurgeon or orthopaedic surgeon you want to see. I will ask my nurse to call you Monday and we can send a referral, thanks  IMPRESSION: This MRI of the lumbar spine without contrast shows multilevel degenerative changes.  Most significant findings are: 1.  At L3-L4, there is moderate spinal stenosis and moderately severe lateral recess stenosis with potential to cause L4 nerve root compression to either side. 2.  At L4-L5, there is severe spinal stenosis and moderately severe right foraminal narrowing and moderately severe bilateral lateral recess stenosis with potential for right L4 bilateral L5 nerve root compression.  Additionally, there is endplate edema consistent with more recent etiology of some of the degenerative changes. 3.  Degenerative changes at other levels are less likely to lead to nerve root compression 

## 2020-12-19 NOTE — Progress Notes (Deleted)
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 At one level the main lumbar canal is severely narrowed("Severe spinal stenosis") which can cause lots of pain and weakness and numbness/tingling/burning. You have several levels where the nerves are pinched("nerve root compression") which causes shooting pain down the legs and weakness. There is a lot going on that may need to be addressed by a Careers adviser. I would like to send you to [facility] unless you have a specific neurosurgeon or orthopaedic surgeon you want to see. I will ask my nurse to call you Monday and we can send a referral, thanks  IMPRESSION: This MRI of the lumbar spine without contrast shows multilevel degenerative changes.  Most significant findings are: 1.  At L3-L4, there is moderate spinal stenosis and moderately severe lateral recess stenosis with potential to cause L4 nerve root compression to either side. 2.  At L4-L5, there is severe spinal stenosis and moderately severe right foraminal narrowing and moderately severe bilateral lateral recess stenosis with potential for right L4 bilateral L5 nerve root compression.  Additionally, there is endplate edema consistent with more recent etiology of some of the degenerative changes. 3.  Degenerative changes at other levels are less likely to lead to nerve root compression

## 2020-12-22 ENCOUNTER — Encounter: Payer: Medicare Other | Admitting: Neurology

## 2020-12-22 ENCOUNTER — Other Ambulatory Visit: Payer: Self-pay | Admitting: Neurology

## 2020-12-22 DIAGNOSIS — M4807 Spinal stenosis, lumbosacral region: Secondary | ICD-10-CM

## 2020-12-22 NOTE — Telephone Encounter (Signed)
Referral sent to [facility]. Phone: [phone]. 

## 2020-12-22 NOTE — Telephone Encounter (Signed)
I called pt and he stated he is ok to go where Dr. Lucia Gaskins recommends [facility].   He has not affiliation with [facility] or orthopaedic surgeon.

## 2020-12-31 ENCOUNTER — Ambulatory Visit (INDEPENDENT_AMBULATORY_CARE_PROVIDER_SITE_OTHER): Payer: Medicare Other | Admitting: Podiatry

## 2020-12-31 ENCOUNTER — Other Ambulatory Visit: Payer: Self-pay

## 2020-12-31 DIAGNOSIS — M79674 Pain in right toe(s): Secondary | ICD-10-CM | POA: Diagnosis not present

## 2020-12-31 DIAGNOSIS — E1142 Type 2 diabetes mellitus with diabetic polyneuropathy: Secondary | ICD-10-CM

## 2020-12-31 DIAGNOSIS — B351 Tinea unguium: Secondary | ICD-10-CM | POA: Diagnosis not present

## 2020-12-31 DIAGNOSIS — M79675 Pain in left toe(s): Secondary | ICD-10-CM | POA: Diagnosis not present

## 2021-01-02 ENCOUNTER — Encounter: Payer: Self-pay | Admitting: Podiatry

## 2021-01-02 NOTE — Progress Notes (Signed)
  Subjective:  Patient ID: [name], male    DOB: August 31, 1940,  MRN: [id number]  Chief Complaint  Patient presents with   Diabetes    Diabetic foot care   80 y.o. male returns for the above complaint.  Patient presents with thickened elongated dystrophic toenails x7.  Mild pain on palpation.  Patient would like to have them debrided on his epidural himself.  He is a diabetic.  He denies any other acute complaints.  Objective:  There were no vitals filed for this visit. Podiatric Exam: Vascular: dorsalis pedis and posterior tibial pulses are palpable bilateral. Capillary return is immediate. Temperature gradient is WNL. Skin turgor WNL  Sensorium: Normal Semmes Weinstein monofilament test. Normal tactile sensation bilaterally. Nail Exam: Pt has thick disfigured discolored nails with subungual debris noted bilateral entire nail hallux through fifth toenails.  Pain on palpation to the nails. Ulcer Exam: There is no evidence of ulcer or pre-ulcerative changes or infection. Orthopedic Exam: Muscle tone and strength are WNL. No limitations in general ROM. No crepitus or effusions noted.  Skin: No Porokeratosis. No infection or ulcers    Assessment & Plan:   1. Pain due to onychomycosis of toenails of both feet   2. Diabetic polyneuropathy associated with type 2 diabetes mellitus (HCC)     Patient was evaluated and treated and all questions answered.  Onychomycosis with pain  -Nails palliatively debrided as below. -Educated on self-care  Procedure: Nail Debridement Rationale: pain  Type of Debridement: manual, sharp debridement. Instrumentation: Nail nipper, rotary burr. Number of Nails: 10  Procedures and Treatment: Consent by patient was obtained for treatment procedures. The patient understood the discussion of treatment and procedures well. All questions were answered thoroughly reviewed. Debridement of mycotic and hypertrophic toenails, 1 through 5 bilateral and clearing of  subungual debris. No ulceration, no infection noted.  Return Visit-Office Procedure: Patient instructed to return to the office for a follow up visit 3 months for continued evaluation and treatment.  Nicholes Rough, DPM    No follow-ups on file.

## 2021-04-02 ENCOUNTER — Ambulatory Visit: Payer: Medicare Other | Admitting: Podiatry

## 2021-04-02 ENCOUNTER — Other Ambulatory Visit: Payer: Self-pay

## 2021-04-02 DIAGNOSIS — M79675 Pain in left toe(s): Secondary | ICD-10-CM | POA: Diagnosis not present

## 2021-04-02 DIAGNOSIS — B351 Tinea unguium: Secondary | ICD-10-CM

## 2021-04-02 DIAGNOSIS — M79674 Pain in right toe(s): Secondary | ICD-10-CM | POA: Diagnosis not present

## 2021-04-02 DIAGNOSIS — E1142 Type 2 diabetes mellitus with diabetic polyneuropathy: Secondary | ICD-10-CM

## 2021-04-02 MED ORDER — CICLOPIROX 8 % EX SOLN: Freq: Every day | CUTANEOUS | 1 refills | Status: AC

## 2021-04-07 IMAGING — CR DG CHEST 2V
2 series · 2 of 2 positions shown · non-contrast
Comparison: None.

CLINICAL DATA: Hypertension

EXAM:
CHEST - 2 VIEW

[w chest pa]
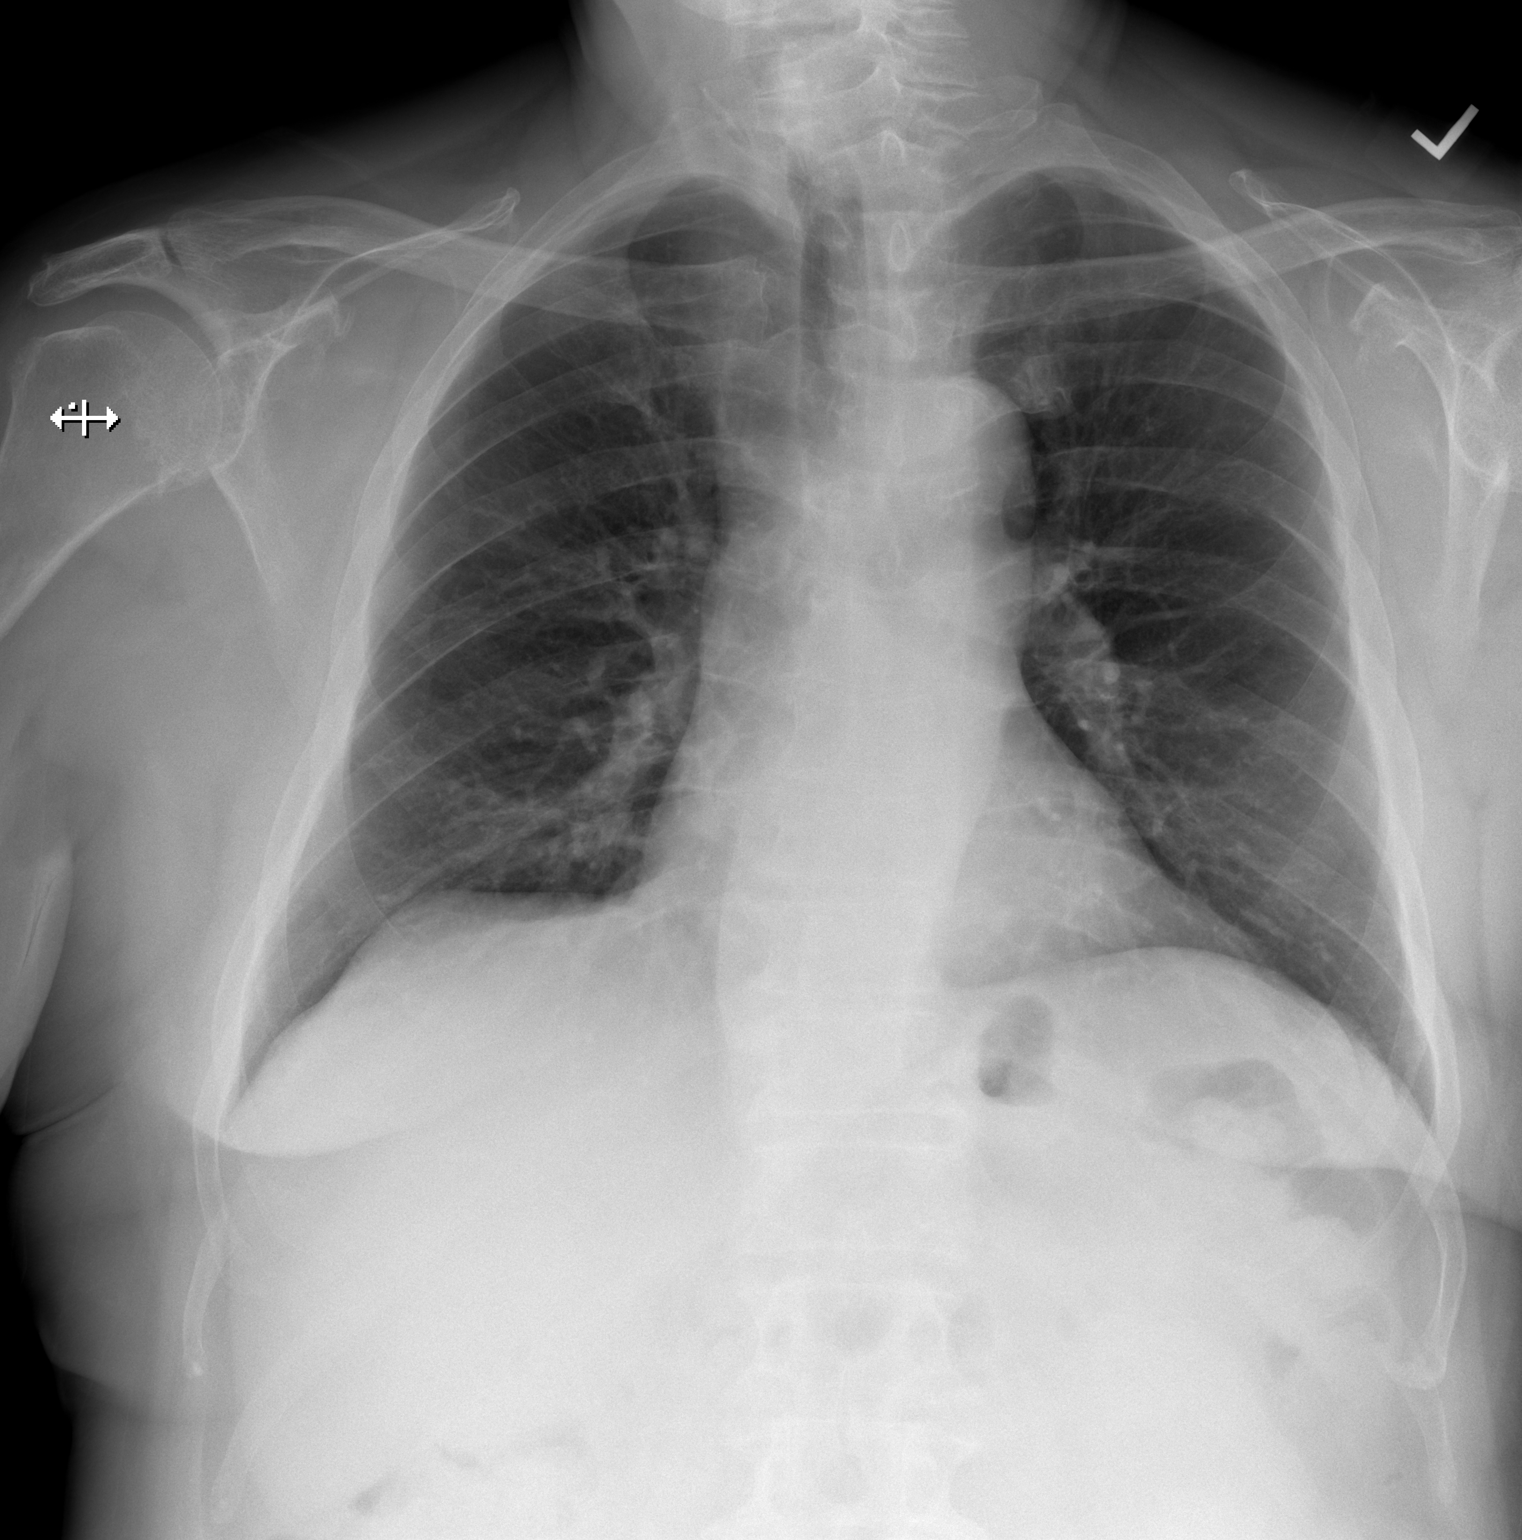

[w chest lat]
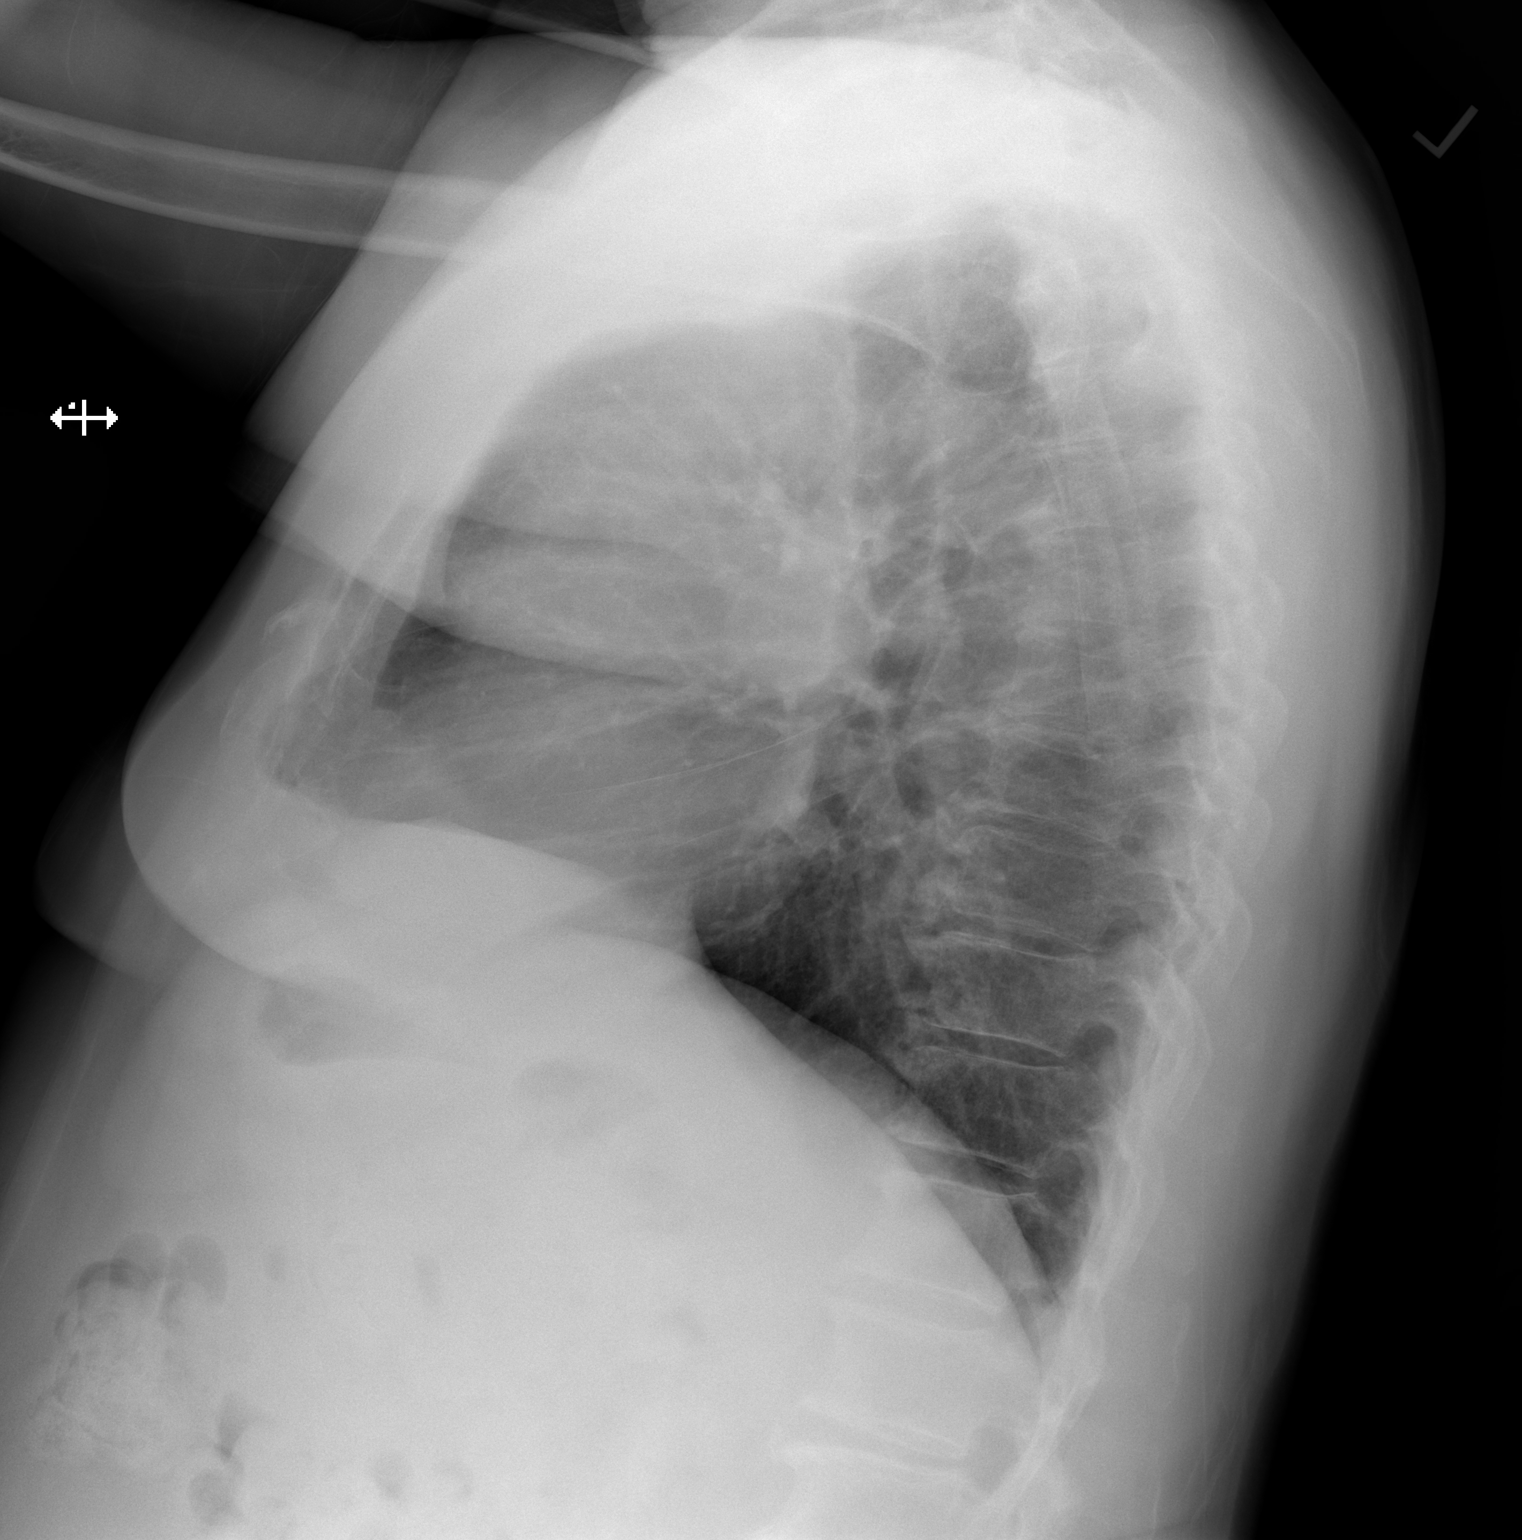

[2 of 2 positions shown; findings below may reference images not displayed]

FINDINGS: The heart size and mediastinal contours are within normal limits.
Both lungs are clear. The visualized skeletal structures are
unremarkable.
IMPRESSION: No active cardiopulmonary disease.

## 2021-04-07 NOTE — Progress Notes (Signed)
°  Subjective:  Patient ID: [name], male    DOB: [birth year],  MRN: [id number]  Chief Complaint  Patient presents with   Nail Problem    Nail trim    81 y.o. male returns for the above complaint.  Patient presents with thickened elongated dystrophic toenails x7.  Mild pain on palpation.  Patient would like to have them debrided on his epidural himself.  He is a diabetic.  He denies any other acute complaints.  Objective:  There were no vitals filed for this visit. Podiatric Exam: Vascular: dorsalis pedis and posterior tibial pulses are palpable bilateral. Capillary return is immediate. Temperature gradient is WNL. Skin turgor WNL  Sensorium: Normal Semmes Weinstein monofilament test. Normal tactile sensation bilaterally. Nail Exam: Pt has thick disfigured discolored nails with subungual debris noted bilateral entire nail hallux through fifth toenails.  Pain on palpation to the nails. Ulcer Exam: There is no evidence of ulcer or pre-ulcerative changes or infection. Orthopedic Exam: Muscle tone and strength are WNL. No limitations in general ROM. No crepitus or effusions noted.  Skin: No Porokeratosis. No infection or ulcers    Assessment & Plan:   1. Pain due to onychomycosis of toenails of both feet   2. Diabetic polyneuropathy associated with type 2 diabetes mellitus (HCC)      Patient was evaluated and treated and all questions answered.  Onychomycosis with pain  -Nails palliatively debrided as below. -Educated on self-care  Procedure: Nail Debridement Rationale: pain  Type of Debridement: manual, sharp debridement. Instrumentation: Nail nipper, rotary burr. Number of Nails: 10  Procedures and Treatment: Consent by patient was obtained for treatment procedures. The patient understood the discussion of treatment and procedures well. All questions were answered thoroughly reviewed. Debridement of mycotic and hypertrophic toenails, 1 through 5 bilateral and clearing of  subungual debris. No ulceration, no infection noted.  Return Visit-Office Procedure: Patient instructed to return to the office for a follow up visit 3 months for continued evaluation and treatment.  Nicholes Rough, DPM    No follow-ups on file.

## 2021-06-23 ENCOUNTER — Other Ambulatory Visit: Payer: Self-pay | Admitting: *Deleted

## 2021-06-23 MED ORDER — GABAPENTIN 300 MG PO CAPS: 300.0000 mg | ORAL_CAPSULE | Freq: Three times a day (TID) | ORAL | 1 refills | Status: AC

## 2021-07-03 ENCOUNTER — Ambulatory Visit: Payer: Medicare Other | Admitting: Podiatry

## 2021-07-13 ENCOUNTER — Encounter: Payer: Self-pay | Admitting: Podiatry

## 2021-07-13 ENCOUNTER — Ambulatory Visit (INDEPENDENT_AMBULATORY_CARE_PROVIDER_SITE_OTHER): Payer: Medicare Other | Admitting: Podiatry

## 2021-07-13 DIAGNOSIS — M79674 Pain in right toe(s): Secondary | ICD-10-CM | POA: Diagnosis not present

## 2021-07-13 DIAGNOSIS — B351 Tinea unguium: Secondary | ICD-10-CM

## 2021-07-13 DIAGNOSIS — M79675 Pain in left toe(s): Secondary | ICD-10-CM

## 2021-07-13 DIAGNOSIS — M129 Arthropathy, unspecified: Secondary | ICD-10-CM | POA: Insufficient documentation

## 2021-07-13 DIAGNOSIS — E1142 Type 2 diabetes mellitus with diabetic polyneuropathy: Secondary | ICD-10-CM

## 2021-07-13 NOTE — Progress Notes (Signed)
This patient returns to my office for at risk foot care.  This patient requires this care by a professional since this patient will be at risk due to having diabetic neuropathy and arthritis.  This patient is unable to cut nails himself since the patient cannot reach his nails.These nails are painful walking and wearing shoes.  This patient presents for at risk foot care today.  General Appearance  Alert, conversant and in no acute stress.  Vascular  Dorsalis pedis and posterior tibial  pulses are palpable  bilaterally.  Capillary return is within normal limits  bilaterally. Temperature is within normal limits  bilaterally.  Neurologic  Senn-Weinstein monofilament wire test within normal limits  bilaterally. Muscle power within normal limits bilaterally.  Nails Thick disfigured discolored nails with subungual debris  from hallux to fifth toes bilaterally. No evidence of bacterial infection or drainage bilaterally.  Orthopedic  No limitations of motion  feet .  No crepitus or effusions noted.  No bony pathology or digital deformities noted. Dorsal rearfoot arthritis.  Skin  normotropic skin with no porokeratosis noted bilaterally.  No signs of infections or ulcers noted.     Onychomycosis  Pain in right toes  Pain in left toes  Arthritis  B/l.  Consent was obtained for treatment procedures.   Mechanical debridement of nails 1-5  bilaterally performed with a nail nipper.  Filed with dremel without incident.    Return office visit   3 months                   Told patient to return for periodic foot care and evaluation due to potential at risk complications.   Martez Weiand DPM   

## 2021-10-16 ENCOUNTER — Ambulatory Visit: Payer: Medicare Other | Admitting: Podiatry

## 2021-10-16 ENCOUNTER — Encounter: Payer: Self-pay | Admitting: Podiatry

## 2021-10-16 DIAGNOSIS — B351 Tinea unguium: Secondary | ICD-10-CM

## 2021-10-16 DIAGNOSIS — M79675 Pain in left toe(s): Secondary | ICD-10-CM | POA: Diagnosis not present

## 2021-10-16 DIAGNOSIS — M129 Arthropathy, unspecified: Secondary | ICD-10-CM

## 2021-10-16 DIAGNOSIS — M79674 Pain in right toe(s): Secondary | ICD-10-CM

## 2021-10-16 DIAGNOSIS — E1142 Type 2 diabetes mellitus with diabetic polyneuropathy: Secondary | ICD-10-CM

## 2021-10-16 NOTE — Progress Notes (Signed)
This patient returns to my office for at risk foot care.  This patient requires this care by a professional since this patient will be at risk due to having diabetic neuropathy and arthritis.  This patient is unable to cut nails himself since the patient cannot reach his nails.These nails are painful walking and wearing shoes.  This patient presents for at risk foot care today.  General Appearance  Alert, conversant and in no acute stress.  Vascular  Dorsalis pedis and posterior tibial  pulses are palpable  bilaterally.  Capillary return is within normal limits  bilaterally. Temperature is within normal limits  bilaterally.  Neurologic  Senn-Weinstein monofilament wire test within normal limits  bilaterally. Muscle power within normal limits bilaterally.  Nails Thick disfigured discolored nails with subungual debris  from hallux to fifth toes bilaterally. No evidence of bacterial infection or drainage bilaterally.  Orthopedic  No limitations of motion  feet .  No crepitus or effusions noted.  No bony pathology or digital deformities noted. Dorsal rearfoot arthritis.  Skin  normotropic skin with no porokeratosis noted bilaterally.  No signs of infections or ulcers noted.     Onychomycosis  Pain in right toes  Pain in left toes  Arthritis  B/l.  Consent was obtained for treatment procedures.   Mechanical debridement of nails 1-5  bilaterally performed with a nail nipper.  Filed with dremel without incident.    Return office visit   3 months                   Told patient to return for periodic foot care and evaluation due to potential at risk complications.   Manfred Laspina DPM   

## 2022-01-20 ENCOUNTER — Other Ambulatory Visit: Payer: Self-pay | Admitting: Neurology

## 2022-01-22 ENCOUNTER — Ambulatory Visit (INDEPENDENT_AMBULATORY_CARE_PROVIDER_SITE_OTHER): Payer: Medicare Other | Admitting: Podiatry

## 2022-01-22 ENCOUNTER — Encounter: Payer: Self-pay | Admitting: Podiatry

## 2022-01-22 DIAGNOSIS — E1142 Type 2 diabetes mellitus with diabetic polyneuropathy: Secondary | ICD-10-CM | POA: Diagnosis not present

## 2022-01-22 DIAGNOSIS — M79675 Pain in left toe(s): Secondary | ICD-10-CM | POA: Diagnosis not present

## 2022-01-22 DIAGNOSIS — B351 Tinea unguium: Secondary | ICD-10-CM

## 2022-01-22 DIAGNOSIS — M129 Arthropathy, unspecified: Secondary | ICD-10-CM | POA: Diagnosis not present

## 2022-01-22 DIAGNOSIS — M79674 Pain in right toe(s): Secondary | ICD-10-CM

## 2022-01-22 NOTE — Progress Notes (Signed)
This patient returns to my office for at risk foot care.  This patient requires this care by a professional since this patient will be at risk due to having diabetic neuropathy and arthritis.  This patient is unable to cut nails himself since the patient cannot reach his nails.These nails are painful walking and wearing shoes.  This patient presents for at risk foot care today.  General Appearance  Alert, conversant and in no acute stress.  Vascular  Dorsalis pedis and posterior tibial  pulses are palpable  bilaterally.  Capillary return is within normal limits  bilaterally. Temperature is within normal limits  bilaterally.  Neurologic  Senn-Weinstein monofilament wire test within normal limits  bilaterally. Muscle power within normal limits bilaterally.  Nails Thick disfigured discolored nails with subungual debris  from hallux to fifth toes bilaterally. No evidence of bacterial infection or drainage bilaterally.  Orthopedic  No limitations of motion  feet .  No crepitus or effusions noted.  No bony pathology or digital deformities noted. Dorsal rearfoot arthritis.  Skin  normotropic skin with no porokeratosis noted bilaterally.  No signs of infections or ulcers noted.     Onychomycosis  Pain in right toes  Pain in left toes  Arthritis  B/l.  Consent was obtained for treatment procedures.   Mechanical debridement of nails 1-5  bilaterally performed with a nail nipper.  Filed with dremel without incident.    Return office visit   3 months                   Told patient to return for periodic foot care and evaluation due to potential at risk complications.   Helane Gunther DPM

## 2022-01-25 ENCOUNTER — Other Ambulatory Visit: Payer: Self-pay | Admitting: Neurology

## 2022-04-30 ENCOUNTER — Ambulatory Visit (INDEPENDENT_AMBULATORY_CARE_PROVIDER_SITE_OTHER): Payer: Medicare Other | Admitting: Podiatry

## 2022-04-30 ENCOUNTER — Encounter: Payer: Self-pay | Admitting: Podiatry

## 2022-04-30 DIAGNOSIS — M79675 Pain in left toe(s): Secondary | ICD-10-CM | POA: Diagnosis not present

## 2022-04-30 DIAGNOSIS — E1142 Type 2 diabetes mellitus with diabetic polyneuropathy: Secondary | ICD-10-CM | POA: Diagnosis not present

## 2022-04-30 DIAGNOSIS — B351 Tinea unguium: Secondary | ICD-10-CM | POA: Diagnosis not present

## 2022-04-30 DIAGNOSIS — M79674 Pain in right toe(s): Secondary | ICD-10-CM

## 2022-04-30 NOTE — Progress Notes (Signed)
This patient returns to my office for at risk foot care.  This patient requires this care by a professional since this patient will be at risk due to having diabetic neuropathy and arthritis.  This patient is unable to cut nails himself since the patient cannot reach his nails.These nails are painful walking and wearing shoes.  This patient presents for at risk foot care today.  General Appearance  Alert, conversant and in no acute stress.  Vascular  Dorsalis pedis are palpable  bilaterally.   Posterior tibial pulses are absent  B/L. Capillary return is within normal limits  bilaterally. Temperature is within normal limits  bilaterally. Absent digital hair.  Neurologic  Senn-Weinstein monofilament wire test within normal limits  bilaterally. Muscle power within normal limits bilaterally.  Nails Thick disfigured discolored nails with subungual debris  from hallux to fifth toes bilaterally. No evidence of bacterial infection or drainage bilaterally.  Thick nails.  Orthopedic  No limitations of motion  feet .  No crepitus or effusions noted.  No bony pathology or digital deformities noted. Dorsal rearfoot arthritis.  Skin  normotropic skin with no porokeratosis noted bilaterally.  No signs of infections or ulcers noted.     Onychomycosis  Pain in right toes  Pain in left toes  Arthritis  B/l.  Consent was obtained for treatment procedures.   Mechanical debridement of nails 1-5  bilaterally performed with a nail nipper.  Filed with dremel without incident.    Return office visit   3 months                   Told patient to return for periodic foot care and evaluation due to potential at risk complications.   Gardiner Barefoot DPM

## 2022-07-30 ENCOUNTER — Ambulatory Visit: Payer: Medicare Other | Admitting: Podiatry
